# Patient Record
Sex: Female | Born: 1935 | Race: White | Hispanic: No | Marital: Married | Smoking: Former smoker
Health system: Southern US, Community
[De-identification: ages and names within clinical notes are randomized; demographics above are authoritative.]

## PROBLEM LIST (undated history)

## (undated) DIAGNOSIS — M1990 Unspecified osteoarthritis, unspecified site: Secondary | ICD-10-CM

## (undated) DIAGNOSIS — J302 Other seasonal allergic rhinitis: Secondary | ICD-10-CM

## (undated) DIAGNOSIS — C4491 Basal cell carcinoma of skin, unspecified: Secondary | ICD-10-CM

## (undated) DIAGNOSIS — J45909 Unspecified asthma, uncomplicated: Secondary | ICD-10-CM

## (undated) DIAGNOSIS — F0390 Unspecified dementia without behavioral disturbance: Secondary | ICD-10-CM

## (undated) DIAGNOSIS — F419 Anxiety disorder, unspecified: Secondary | ICD-10-CM

## (undated) HISTORY — DX: Anxiety disorder, unspecified: F41.9

## (undated) HISTORY — PX: BREAST LUMPECTOMY: SHX2

## (undated) HISTORY — DX: Unspecified osteoarthritis, unspecified site: M19.90

## (undated) HISTORY — PX: APPENDECTOMY: SHX54

## (undated) HISTORY — PX: HIP SURGERY: SHX245

## (undated) HISTORY — PX: JOINT REPLACEMENT: SHX530

## (undated) HISTORY — PX: LUMBAR DISC SURGERY: SHX700

## (undated) HISTORY — PX: TONSILLECTOMY: SUR1361

## (undated) HISTORY — DX: Unspecified asthma, uncomplicated: J45.909

## (undated) HISTORY — DX: Other seasonal allergic rhinitis: J30.2

## (undated) HISTORY — DX: Basal cell carcinoma of skin, unspecified: C44.91

---

## 1966-02-22 HISTORY — PX: OTHER SURGICAL HISTORY: SHX169

## 1998-10-24 ENCOUNTER — Other Ambulatory Visit: Admission: RE | Admit: 1998-10-24 | Discharge: 1998-10-24 | Payer: Self-pay | Admitting: Obstetrics and Gynecology

## 2000-02-11 ENCOUNTER — Encounter: Admission: RE | Admit: 2000-02-11 | Discharge: 2000-02-11 | Payer: Self-pay | Admitting: Internal Medicine

## 2000-02-11 ENCOUNTER — Encounter: Payer: Self-pay | Admitting: Internal Medicine

## 2000-08-26 ENCOUNTER — Encounter: Admission: RE | Admit: 2000-08-26 | Discharge: 2000-08-26 | Payer: Self-pay | Admitting: Internal Medicine

## 2000-08-26 ENCOUNTER — Encounter: Payer: Self-pay | Admitting: Internal Medicine

## 2001-05-23 ENCOUNTER — Ambulatory Visit (HOSPITAL_COMMUNITY): Admission: RE | Admit: 2001-05-23 | Discharge: 2001-05-23 | Payer: Self-pay | Admitting: Internal Medicine

## 2001-05-23 HISTORY — PX: COLONOSCOPY: SHX174

## 2001-06-19 ENCOUNTER — Encounter: Payer: Self-pay | Admitting: Otolaryngology

## 2001-06-19 ENCOUNTER — Ambulatory Visit (HOSPITAL_COMMUNITY): Admission: RE | Admit: 2001-06-19 | Discharge: 2001-06-19 | Payer: Self-pay | Admitting: Otolaryngology

## 2002-08-24 ENCOUNTER — Ambulatory Visit (HOSPITAL_COMMUNITY): Admission: RE | Admit: 2002-08-24 | Discharge: 2002-08-24 | Payer: Self-pay | Admitting: Internal Medicine

## 2002-08-24 ENCOUNTER — Encounter: Payer: Self-pay | Admitting: Internal Medicine

## 2003-03-12 ENCOUNTER — Ambulatory Visit (HOSPITAL_COMMUNITY): Admission: RE | Admit: 2003-03-12 | Discharge: 2003-03-12 | Payer: Self-pay | Admitting: *Deleted

## 2004-03-31 ENCOUNTER — Ambulatory Visit (HOSPITAL_COMMUNITY): Admission: RE | Admit: 2004-03-31 | Discharge: 2004-03-31 | Payer: Self-pay | Admitting: Internal Medicine

## 2004-08-11 ENCOUNTER — Emergency Department (HOSPITAL_COMMUNITY): Admission: EM | Admit: 2004-08-11 | Discharge: 2004-08-11 | Payer: Self-pay | Admitting: Emergency Medicine

## 2005-07-01 ENCOUNTER — Ambulatory Visit (HOSPITAL_COMMUNITY): Admission: RE | Admit: 2005-07-01 | Discharge: 2005-07-01 | Payer: Self-pay | Admitting: Internal Medicine

## 2005-09-06 ENCOUNTER — Ambulatory Visit: Payer: Self-pay | Admitting: Orthopedic Surgery

## 2006-07-04 ENCOUNTER — Ambulatory Visit: Payer: Self-pay | Admitting: Orthopedic Surgery

## 2006-08-08 ENCOUNTER — Ambulatory Visit: Payer: Self-pay | Admitting: Orthopedic Surgery

## 2006-09-06 ENCOUNTER — Encounter: Payer: Self-pay | Admitting: Orthopedic Surgery

## 2006-09-06 ENCOUNTER — Ambulatory Visit: Payer: Self-pay | Admitting: Orthopedic Surgery

## 2006-09-06 ENCOUNTER — Inpatient Hospital Stay (HOSPITAL_COMMUNITY): Admission: RE | Admit: 2006-09-06 | Discharge: 2006-09-10 | Payer: Self-pay | Admitting: Orthopedic Surgery

## 2006-09-21 ENCOUNTER — Ambulatory Visit: Payer: Self-pay | Admitting: Orthopedic Surgery

## 2006-10-19 ENCOUNTER — Ambulatory Visit: Payer: Self-pay | Admitting: Orthopedic Surgery

## 2006-12-01 ENCOUNTER — Ambulatory Visit: Payer: Self-pay | Admitting: Orthopedic Surgery

## 2006-12-01 DIAGNOSIS — M169 Osteoarthritis of hip, unspecified: Secondary | ICD-10-CM

## 2007-08-28 ENCOUNTER — Encounter: Payer: Self-pay | Admitting: Orthopedic Surgery

## 2007-09-18 ENCOUNTER — Ambulatory Visit: Payer: Self-pay | Admitting: Orthopedic Surgery

## 2007-09-18 DIAGNOSIS — Z96649 Presence of unspecified artificial hip joint: Secondary | ICD-10-CM

## 2008-09-18 ENCOUNTER — Ambulatory Visit: Payer: Self-pay | Admitting: Orthopedic Surgery

## 2008-09-18 DIAGNOSIS — M7580 Other shoulder lesions, unspecified shoulder: Secondary | ICD-10-CM

## 2008-09-23 ENCOUNTER — Emergency Department (HOSPITAL_COMMUNITY): Admission: EM | Admit: 2008-09-23 | Discharge: 2008-09-23 | Payer: Self-pay | Admitting: Emergency Medicine

## 2010-07-07 NOTE — Op Note (Signed)
[name], [name] NO.:  [id number]   MEDICAL RECORD NO.:  [id number]          PATIENT TYPE:  AMB   LOCATION:  DAY                           FACILITY:  [facility]   PHYSICIAN:  Vickki Hearing, M.D.DATE OF BIRTH:  24-Dec-1931   DATE OF PROCEDURE:  09/06/2006  DATE OF DISCHARGE:                               OPERATIVE REPORT   HISTORY:  A 77 year old female with disabling osteoarthritis of the  right hip.  She was treated with oral medications, decrease in activity  and persisted to have problems with activities of daily living; she  could shop, she could not walk very far without disabling pain.   INDICATION:  Disabling pain.   PREOPERATIVE DIAGNOSIS:  Osteoarthritis, right hip.   POSTOPERATIVE DIAGNOSIS:  Osteoarthritis, right hip.   PROCEDURE:  Right total hip arthroplasty.   IMPLANTS:  DePuy Corail high-offset 11 Press-Fit stem, Pinnacle 50 cup  with 20- and 25-mm 6.5 screws; hole eliminator was used as well.  The  liner was a metal 36 inner diameter, 50 outer diameter liner and a 36 +5  head.   FINDINGS:  There was severe osteoarthritis of the entire hip joint,  femoral and acetabular sides.   BLOOD LOSS:  250 mL, estimated.   ANESTHETIC:  Spinal.   ASSISTANTS:  1. Wayne McFadder.  2. Valetta Close.   SURGEON:  Vickki Hearing, M.D.   COUNTS OF NEEDLES AND SPONGES:  Correct at end of procedure.   RADIOGRAPH:  Taken intraoperatively showed that the components were in  excellent alignment.   SPECIMEN:  Femoral head, went to Pathology.   DETAILS OF PROCEDURE:  Rachel [name] was identified in the preop holding  area.  The right hip was marked as the surgical site; I countersigned  that.  The history and physical was also updated and her antibiotics  were started.  She was taken to the operating room for spinal  anesthetic.  She was then placed in lateral decubitus position, right  side up, axillary roll, padding of left and right arm,  padding of left  leg.  She then had a sterile prep and drape with DuraPrep.  A time-out  procedure was completed.  Procedure was confirmed as a right total hip  arthroplasty on Rachel [name].  Antibiotics were confirmed to be  within an hour of our skin incision.   Skin incision was made over the greater trochanter and extended  proximally and distally down to the fascial layer.  The fascial layer  was then split in line with the skin incision, exposing the greater  trochanteric bursa, which was resected.  We then identified the anterior  and posterior aspects of the gluteus medius and minimus tendons.   The anterior half of the abductor mass was resected in line with the  vastus lateralis and reflected anteriorly as 1 continuous flap.  The hip  capsule was exposed and excised.  Two pins were placed in the acetabulum  to retract the musculature and soft tissue.  The hip was dislocated and  the head was removed with an oscillating saw.   The leg  was placed in position and a femoral neck cutting guide was  placed.  The second neck cut was made and curette was used to enter the  canal.  Starter reamer was passed followed by serial broaching up to a  size 11.  We then planed the femoral neck and accepted an 11 as our stem  size.   We then placed the leg in extension, exposed the acetabulum by removing  osteophytes and soft tissue, identified the depths of it with a curette.  We started with a 44 reamer, reached the bottom of the acetabulum and  then reamed in sequential fashion up to a size 50.  We took a 50 trial  cup and it bottomed out well.  We then trialed of the cup in 45 degrees  of abduction and 15-20 degrees of anterior version.  We used a 36 +5  head and found that to be stable with good tension on the abductors,  excellent hip flexion, internal rotation, adduction and extension,  external rotation.   We then removed our trial components, passed sutures through the greater   trochanter, passed our 11 high-offset stem and got an excellent fit.  We  placed the acetabular cup, applied 2 screws, placed the hole eliminator,  the metal liner and then reduced the hip with a +5 thirty-six head.  Excellent range of motion was obtained.  This matched our trials.  We  then took an x-ray and was happy with the x-ray.  The wounds were then  irrigated and closed by closing the abductors with the leg internally  rotated using the sutures we had placed through the greater trochanter.  We then closed the fascial layer with interrupted suture of #1 Bralon.  We used further interrupted sutures to close the fascial layer and 0 and  2-0 Monocryl to close the subcu tissue.  Skin staples were used to  reapproximate the skin edges.  We did 2 additional things; at the end of  the fascial layer closure, we injected 30 mL of Marcaine deep to this  tissue; we passed a pain pump catheter above this layer and the subcu  tissue.  We applied sterile dressings, placed the patient supine on the  bed and checked her leg lengths; they were absolutely equal.  We applied  the abduction pillow and took the patient to recovery room in stable  condition.      Vickki Hearing, M.D.  Electronically Signed     SEH/MEDQ  D:  09/06/2006  T:  09/07/2006  Job:  [id number]

## 2010-07-07 NOTE — H&P (Signed)
Rachel [name], CLINE NO.:  [id number]   MEDICAL RECORD NO.:  [id number]           PATIENT TYPE:  AMB   LOCATION:                                FACILITY:  [facility]   PHYSICIAN:  Vickki Hearing, M.D.DATE OF BIRTH:  1932/02/19   DATE OF ADMISSION:  DATE OF DISCHARGE:  LH                              HISTORY & PHYSICAL   CHIEF COMPLAINT:  Right hip pain.   HISTORY:  This is a 77 year old female who presented to me July 2007  with right hip and groin pain which radiated to the right knee and  occasionally to the right buttock.  She is status post lumbar  laminectomy in 1985 and indicated that this pain does not feel like the  pain she had at that time. At that time her pain had been present for  over 3 months, and she was worked up, found to be taking Advil with good  relief, and we put her an exercise program after x-rays showed an  osteoarthritic hip and some degenerative disk disease in the lumbar  spine. I have seen her on two separate occasions since then, and we have  noted that she is having increased pain.  Her repeat x-rays showed  progression of her arthritis, so she has failed nonoperative care.  She  has groin pain with ambulation. She cannot do the activities that she  likes, and she has opted for total hip replacement.   REVIEW OF SYSTEMS:  Eight were normal.  Two were positive.  SKIN:  Itching.  MUSCULOSKELETAL:  Joint pain.   PAST HISTORY:  No known allergies.   MEDICAL PROBLEMS:  None.   SURGERY:  1. Tonsillectomy.  2. Appendectomy.  3. Lumpectomy.  4. Lumbar disk surgery.   PHARMACY:  [facility].   MEDICATIONS:  1. Paxil 10.  2. Fosamax 70.  3. Prempro 0.3/1.5/  4. Benadryl.  5. Calcium with vitamin D.  6. Ocuvite.  7. Allergy shots weekly.   FAMILY HISTORY:  Osteoarthritis and asthma.   SOCIAL HISTORY:  Family physician is Dr. Ouida Sills. She is married.  She is  a Futures trader.  She does not smoke. alcohol use:  Moderate.  Caffeine use:  3-4 cups a day. Education:  1 year of college. She has a supportive  husband, and her daughters is one of the CRNAs here at [facility]   PHYSICAL EXAMINATION:  VITAL SIGNS:  Weight 145, pulse 64, respiratory  rate 18.  GENERAL APPEARANCE:  Normal development, grooming, nutrition, hygiene,  body habitus, ectomorphic.  PERIPHERAL VASCULAR SYSTEM:  Pulses strong, temperature normal. No  edema, tenderness or swelling.  No major varicose veins.  LYMPHATIC/ENDOCRINE:  Normal.  MUSCULOSKELETAL:  Gait and station did not show appreciable limp.  RIGHT LOWER EXTREMITY:  Examination revealed hip flexion only to 90  degrees where there was pain.  She had painful internal rotation,  limited 15 degrees, external rotation approximately 45 degrees. Hip was  stable.  Muscle strength and tone normal.  LEFT HIP:  120 degrees flexion, internal rotation 30, external rotation  45, stable.  Strength and  muscle tone normal.  No pain.  UPPER EXTREMITY:  On the left, normal alignment.  No contracture,  subluxation, atrophy, tremor, or weakness.  RIGHT UPPER EXTREMITY:  Mild impingement, otherwise alignment normal.  Motor strength showed some weakness in the supraspinatus. There is no  atrophy, tremor or subluxation or instability.  SKIN:  All four extremities normal.  NEUROPSYCH:  Testing of the neuropsych system showed normal  coordination, reflexes, sensation, orientation, and mood.   Radiographs show end-stage arthritis of the right hip.   DIAGNOSIS:  Right hip osteoarthritis.   PLAN:  Right total hip arthroplasty.   Informed consent process has been completed with the patient after  previously giving her literature about total hips.  I answered all her  questions.  We reviewed several things which include but are not limited  to bleeding, infection and its treatment, DVT, pulmonary embolus,  dislocation and its treatment, loosening, and she, in the presence of  her daughter, agreed to  go ahead with the surgery with the known risks  as stated.   PLAN:  Right total hip with a DePuy implant.      Vickki Hearing, M.D.  Electronically Signed     SEH/MEDQ  D:  08/08/2006  T:  08/08/2006  Job:  [id number]

## 2010-07-10 NOTE — Discharge Summary (Signed)
Rachel [name], Rachel [name] NO.:  [id number]   MEDICAL RECORD NO.:  [id number]          PATIENT TYPE:  INP   LOCATION:  A338                          FACILITY:  [facility]   PHYSICIAN:  Vickki Hearing, M.D.DATE OF BIRTH:  02/25/1931   DATE OF ADMISSION:  09/06/2006  DATE OF DISCHARGE:  07/19/2008LH                               DISCHARGE SUMMARY   ADMITTING DIAGNOSIS:  Osteoarthritis right hip.   HISTORY:  This is a 77 year old female with end-stage osteoarthritis of  the right hip.  She presented with right hip and groin pain which  radiated to the right knee, and occasionally to the right buttock.  She  is status post lumbar laminectomy in 1985.  This patient was initially  treated with continuation of Advil, which gave her good relief  initially; an exercise program, and serial x-rays.  Her pain increased.  Repeat radiographs showed progression of her arthritis.  Her activities  of daily living became more difficult, and after reviewing the risks and  benefits of surgery versus continued non-operative treatment, she wished  to have a right total hip replacement.   The patient was admitted on September 06, 2006 she was admitted to Day  Surgery.  She underwent an uncomplicated right total hip replacement  with a metal-on-metal DePuy total hip system.  She lost 250 mL of blood  under a spinal anesthetic.  There are no complications.  The femoral  head was sent to pathology. Dr. Romeo Apple was the operating surgeon.   IMPLANTS:  Were the following:  1. Corail high offset #11 hip stem Press-Fit.  2. Pinnacle Sector II acetabular cup size 50 with a hole eliminator.  3. A 36 mm inner diameter x 50 mm outer diameter Pinnacle metal      insert.  4. Two cancellous bone screws.  5. A size 36 mm, +5 Articul/EZE metal-on-metal femoral head   HOSPITAL COURSE:  The patient's hospital course was marked by  postoperative anemia, with hemoglobin dropping to 8.1.  She was  transfused 1  auto unit and was discharged home with a hemoglobin of 10.  She also had increased right hip pain, which was clinically associated  with radicular symptoms from her lumbar spine.  This was treated with  Vicodin on a scheduled basis every 4 hours, Robaxin 500 mg every 6  hours; this seemed to give her relief and she was discharged home.  She  was able to ambulate 80 feet with a stable gait pattern, weightbearing  as tolerated on the right leg with a walker, and minimal assistance.   LABORATORY STUDIES:  Hemoglobin 10.6 (initial hemoglobin was 12.6).  Chem-7:  Potassium 4, glucose 119, BUN 6, creatinine 0.67.  GFR was  greater than 60 (which was appropriate).   DISCHARGE MEDICATIONS:  1. Feosol one p.o. b.i.d.  2. Lovenox 30 mg subcu 21 doses.  3. Robaxin 500 mg q. 6 p.r.n. spasms.  She was to resume:  1. Caltrate with vitamin D 600 mg daily.  2. Benadryl 25 mg daily.  3. Ocuvite one daily.  4. Albuterol inhaler as needed.  5. Pulmicort  2 puffs daily as needed.   FOLLOW-UP:  Within 2 weeks.   DISCHARGE INSTRUCTIONS:  She was to have physical therapy with anterior  hip precautions.      Vickki Hearing, M.D.  Electronically Signed     SEH/MEDQ  D:  09/24/2006  T:  09/25/2006  Job:  [id number]

## 2010-07-10 NOTE — Op Note (Signed)
[facility]  Patient:    Rachel [name], Rachel [name]. Visit Number: [id number] MRN: [id number]          Service Type: Attending:  Roetta Sessions, M.D. Dictated by:   Roetta Sessions, M.D. Proc. Date: 05/23/01   CC:         Carylon Perches, M.D.   Operative Report  PROCEDURE:  Screening colonoscopy.  ENDOSCOPIST:  Roetta Sessions, M.D.  INDICATIONS FOR PROCEDURE:  This patient is a 77 year old lady referred for colorectal cancer screening.  She is devoid of any lower GI tract symptoms. She has a family history of a second degree relative with colorectal cancer. A sigmoidoscopy 5 years ago was reportedly negative.  Colonoscopy is now being done as advance screening.  This approach has been discussed with the patient at the bedside.  The potential risks, benefits, and alternatives have been reviewed.  I feel that she is low-risk for conscious sedation in the way of Versed and Demerol.  Please see my handwritten H&P for more information.  PROCEDURE NOTE:  O2 saturation, blood pressure, pulse, and respirations were monitored throughout the entirety procedure.  CONSCIOUS SEDATION:  Versed 3 mg IV, Demerol 75 mg IV in divided doses.  INSTRUMENT:  Olympus videochip colonoscope.  FINDINGS:  The digital rectal exam revealed no abnormalities.  VIDEOSCOPIC FINDINGS:  The prep was good.  RECTUM:  Examination of the rectal mucosa including a retroflex view of the anal verge revealed only anal papilla, and internal hemorrhoid.  The rectum otherwise appeared normal.  COLON:  The colonic mucosa was surveyed from the rectosigmoid junction through the left transverse right colon to the area of the appendiceal orifice, ileocecal valve, and cecum.  These structures were well seen and photographed for the record.  The patient was noted to have scattered left-sided diverticula.  The remainder of the colonic mucosa appeared normal.  From the level of the cecum, ileocecal valve the  scope was slowly withdrawn.  All previously mentioned mucosal surfaces were again seen; and again, no other abnormalities were observed.  The patient tolerated the procedure well and was reacted in endoscopy.  IMPRESSION: 1. Anal papilla and internal hemorrhoids, otherwise normal rectum. 2. Left-sided diverticula.  The remainder of the colonic mucosa appeared    normal.  RECOMMENDATIONS: 1. Diverticulosis literature provided to the patient. 2. Repeat colonoscopy in 10 years. Dictated by:   Roetta Sessions, M.D. Attending:  Roetta Sessions, M.D. DD:  05/23/01 TD:  05/25/01 Job: [id number]

## 2010-12-07 LAB — BASIC METABOLIC PANEL
BUN: 6
BUN: 6
CO2: 29
CO2: 30
Calcium: 8.2 — ABNORMAL LOW
Chloride: 104
Chloride: 104
Creatinine, Ser: 0.67
GFR calc Af Amer: >60
GFR calc non Af Amer: >60
Glucose, Bld: 119 — ABNORMAL HIGH
Glucose, Bld: 120 — ABNORMAL HIGH
Glucose, Bld: 145 — ABNORMAL HIGH
Potassium: 4
Potassium: 4
Potassium: 4.5
Sodium: 137
Sodium: 141

## 2010-12-07 LAB — CBC
HCT: 26.7 — ABNORMAL LOW
Hemoglobin: 10 — ABNORMAL LOW
Hemoglobin: 9.1 — ABNORMAL LOW
MCHC: 34.3
MCV: 89.7
MCV: 90.6
Platelets: 200
Platelets: 203
RBC: 2.98 — ABNORMAL LOW
RBC: 3.24 — ABNORMAL LOW
RDW: 13.7
RDW: 14.1 — ABNORMAL HIGH
WBC: 10.7 — ABNORMAL HIGH
WBC: 11.4 — ABNORMAL HIGH

## 2010-12-07 LAB — DIFFERENTIAL
Basophils Absolute: 0
Basophils Absolute: 0
Basophils Relative: 0
Basophils Relative: 0
Eosinophils Absolute: 0
Eosinophils Absolute: 0.1
Eosinophils Absolute: 0.2
Eosinophils Relative: 1
Eosinophils Relative: 1
Lymphocytes Relative: 14
Lymphocytes Relative: 15
Lymphs Abs: 1
Lymphs Abs: 1.5
Monocytes Absolute: 1.2 — ABNORMAL HIGH
Monocytes Relative: 11
Monocytes Relative: 12 — ABNORMAL HIGH
Neutro Abs: 6.3
Neutro Abs: 7.9 — ABNORMAL HIGH
Neutro Abs: 8.1 — ABNORMAL HIGH
Neutrophils Relative %: 74

## 2010-12-08 LAB — BASIC METABOLIC PANEL
Chloride: 105
GFR calc Af Amer: >60
GFR calc non Af Amer: >60
Glucose, Bld: 87
Potassium: 4.2

## 2010-12-08 LAB — CBC
Hemoglobin: 12.6
RBC: 4.21
WBC: 7.3

## 2010-12-08 LAB — CROSSMATCH: Antibody Screen: NEGATIVE

## 2011-12-28 ENCOUNTER — Telehealth: Payer: Self-pay

## 2011-12-28 NOTE — Telephone Encounter (Signed)
Pt had called to schedule next colonoscopy. Last one was 05/2001. Having a little constipation and little blood in stool/ and on paper. Ov with Lorenza Burton, NP on 01/10/2012 at 3:00 PM.

## 2012-01-06 ENCOUNTER — Other Ambulatory Visit (HOSPITAL_COMMUNITY): Payer: Self-pay | Admitting: Internal Medicine

## 2012-01-06 DIAGNOSIS — Z139 Encounter for screening, unspecified: Secondary | ICD-10-CM

## 2012-01-07 ENCOUNTER — Encounter: Payer: Self-pay | Admitting: Internal Medicine

## 2012-01-10 ENCOUNTER — Encounter: Payer: Self-pay | Admitting: Urgent Care

## 2012-01-10 ENCOUNTER — Ambulatory Visit (INDEPENDENT_AMBULATORY_CARE_PROVIDER_SITE_OTHER): Payer: Medicare Other | Admitting: Urgent Care

## 2012-01-10 VITALS — BP 122/67 | HR 87 | Temp 97.5°F | Ht 61.5 in | Wt 144.8 lb

## 2012-01-10 DIAGNOSIS — K921 Melena: Secondary | ICD-10-CM

## 2012-01-10 MED ORDER — PEG-KCL-NACL-NASULF-NA ASC-C 100 G PO SOLR: 1.0000 | ORAL | Status: DC

## 2012-01-10 NOTE — Patient Instructions (Addendum)
Colonoscopy with Dr Jena Gauss Bloody Stools Bloody stools often mean that there is a problem in the digestive tract. Your caregiver may use the term "melena" to describe black, tarry, and bad smelling stools or "hematochezia" to describe red or maroon-colored stools. Blood seen in the stool can be caused by bleeding anywhere along the intestinal tract.  A black stool usually means that blood is coming from the upper part of the gastrointestinal tract (esophagus, stomach, or small bowel). Passing maroon-colored stools or bright red blood usually means that blood is coming from lower down in the large bowel or the rectum. However, sometimes massive bleeding in the stomach or small intestine can cause bright red bloody stools.  Consuming black licorice, lead, iron pills, medicines containing bismuth subsalicylate, or blueberries can also cause black stools. Your caregiver can test black stools to see if blood is present. It is important that the cause of the bleeding be found. Treatment can then be started, and the problem can be corrected. Rectal bleeding may not be serious, but you should not assume everything is okay until you know the cause.It is very important to follow up with your caregiver or a specialist in gastrointestinal problems. CAUSES  Blood in the stools can come from various underlying causes.Often, the cause is not found during your first visit. Testing is often needed to discover the cause of bleeding in the gastrointestinal tract. Causes range from simple to serious or even life-threatening.Possible causes include:  Hemorrhoids.These are veins that are full of blood (engorged) in the rectum. They cause pain, inflammation, and may bleed.  Anal fissures.These are areas of painful tearing which may bleed. They are often caused by passing hard stool.  Diverticulosis.These are pouches that form on the colon over time, with age, and may bleed significantly.  Diverticulitis.This is  inflammation in areas with diverticulosis. It can cause pain, fever, and bloody stools, although bleeding is rare.  Proctitis and colitis. These are inflamed areas of the rectum or colon. They may cause pain, fever, and bloody stools.  Polyps and cancer. Colon cancer is a leading cause of preventable cancer death.It often starts out as precancerous polyps that can be removed during a colonoscopy, preventing progression into cancer. Sometimes, polyps and cancer may cause rectal bleeding.  Gastritis and ulcers.Bleeding from the upper gastrointestinal tract (near the stomach) may travel through the intestines and produce black, sometimes tarry, often bad smelling stools. In certain cases, if the bleeding is fast enough, the stools may not be black, but red and the condition may be life-threatening. SYMPTOMS  You may have stools that are bright red and bloody, that are normal color with blood on them, or that are dark black and tarry. In some cases, you may only have blood in the toilet bowl. Any of these cases need medical care. You may also have:  Pain at the anus or anywhere in the rectum.  Lightheadedness or feeling faint.  Extreme weakness.  Nausea or vomiting.  Fever. DIAGNOSIS Your caregiver may use the following methods to find the cause of your bleeding:  Taking a medical history. Age is important. Older people tend to develop polyps and cancer more often. If there is anal pain and a hard, large stool associated with bleeding, a tear of the anus may be the cause. If blood drips into the toilet after a bowel movement, bleeding hemorrhoids may be the problem. The color and frequency of the bleeding are additional considerations. In most cases, the medical history provides clues,  but seldom the final answer.  A visual and finger (digital) exam. Your caregiver will inspect the anal area, looking for tears and hemorrhoids. A finger exam can provide information when there is tenderness or a  growth inside. In men, the prostate is also examined.  Endoscopy. Several types of small, long scopes (endoscopes) are used to view the colon.  In the office, your caregiver may use a rigid, or more commonly, a flexible viewing sigmoidoscope. This exam is called flexible sigmoidoscopy. It is performed in 5 to 10 minutes.  A more thorough exam is accomplished with a colonoscope. It allows your caregiver to view the entire 5 to 6 foot long colon. Medicine to help you relax (sedative) is usually given for this exam. Frequently, a bleeding lesion may be present beyond the reach of the sigmoidoscope. So, a colonoscopy may be the best exam to start with. Both exams are usually done on an outpatient basis. This means the patient does not stay overnight in the hospital or surgery center.  An upper endoscopy may be needed to examine your stomach. Sedation is used and a flexible endoscope is put in your mouth, down to your stomach.  A barium enema X-ray. This is an X-ray exam. It uses liquid barium inserted by enema into the rectum. This test alone may not identify an actual bleeding point. X-rays highlight abnormal shadows, such as those made by lumps (tumors), diverticuli, or colitis. TREATMENT  Treatment depends on the cause of your bleeding.   For bleeding from the stomach or colon, the caregiver doing your endoscopy or colonoscopy may be able to stop the bleeding as part of the procedure.  Inflammation or infection of the colon can be treated with medicines.  Many rectal problems can be treated with creams, suppositories, or warm baths.  Surgery is sometimes needed.  Blood transfusions are sometimes needed if you have lost a lot of blood.  For any bleeding problem, let your caregiver know if you take aspirin or other blood thinners regularly. HOME CARE INSTRUCTIONS   Take any medicines exactly as prescribed.  Keep your stools soft by eating a diet high in fiber. Prunes (1 to 3 a day) work  well for many people.  Drink enough water and fluids to keep your urine clear or pale yellow.  Take sitz baths if advised. A sitz bath is when you sit in a bathtub with warm water for 10 to 15 minutes to soak, soothe, and cleanse the rectal area.  If enemas or suppositories are advised, be sure you know how to use them. Tell your caregiver if you have problems with this.  Monitor your bowel movements to look for signs of improvement or worsening. SEEK MEDICAL CARE IF:   You do not improve in the time expected.  Your condition worsens after initial improvement.  You develop any new symptoms. SEEK IMMEDIATE MEDICAL CARE IF:   You develop severe or prolonged rectal bleeding.  You vomit blood.  You feel weak or faint.  You have a fever. MAKE SURE YOU:  Understand these instructions.  Will watch your condition.  Will get help right away if you are not doing well or get worse. Document Released: 01/29/2002 Document Revised: 05/03/2011 Document Reviewed: 06/26/2010 [facility] Patient Information 2013 [place], [place].

## 2012-01-10 NOTE — Assessment & Plan Note (Signed)
Rachel [name] is a pleasant 78 y.o. female due for screening colonoscopy. Upon further triage, she noted one episode of hematochezia after a hard stool. I suspect this may have been from her hemorrhoids. We'll proceed with colonoscopy with Dr. Jena Gauss for further evaluation.  I have discussed risks & benefits which include, but are not limited to, bleeding, infection, perforation & drug reaction.  The patient agrees with this plan & written consent will be obtained.

## 2012-01-10 NOTE — Progress Notes (Signed)
Primary Care Physician:  FAGAN,ROY, MD Primary Gastroenterologist:  Dr. Rourk  Chief Complaint  Patient presents with  . Colonoscopy  . Constipation   HPI:  Rachel [name] is a 78 y.o. female here to set up colonoscopy. Last colonoscopy was in April 2003 by Dr. Rourk and she was found to have anal papilla, internal hemorrhoids, and left-sided diverticula. She has had one recent episode of hematochezia in the way of a small amount of bright red blood on the toilet tissue with wiping after a hard bowel movement.  She is seeing no further rectal bleeding. She denies abdominal pain or diarrhea. Denies heartburn, indigestion, nausea, vomiting, dysphagia, odynophagia or anorexia.  She takes IBU 400mg AM & 400mg in PM for arthritis & she feels that she cannot function without it.    Past Medical History  Diagnosis Date  . Anxiety   . Basal cell cancer   . Arthritis     Past Surgical History  Procedure Date  . Colonoscopy 05/23/01     RMR:Anal papilla and internal hemorrhoids, otherwise normal rectum/  Left-sided diverticula.  The remainder of the colonic mucosa appeared normal  . Tonsillectomy   . Appendectomy   . Breast lumpectomy   . Lumbar disc surgery   . Hip surgery     right  . Bladder tack 1968    Current Outpatient Prescriptions  Medication Sig Dispense Refill  . beta carotene w/minerals (OCUVITE) tablet Take 1 tablet by mouth daily.      . citalopram (CELEXA) 20 MG tablet Take 20 mg by mouth daily.      . ibuprofen (ADVIL,MOTRIN) 200 MG tablet Take 400 mg by mouth 2 (two) times daily as needed.       . PROAIR HFA 108 (90 BASE) MCG/ACT inhaler       . peg 3350 powder (MOVIPREP) 100 G SOLR Take 1 kit (100 g total) by mouth as directed.  1 kit  0    Allergies as of 01/10/2012  . (No Known Allergies)    Family History:There is no known family history of colorectal carcinoma , liver disease, or inflammatory bowel disease in first-degree relatives.   Problem Relation Age  of Onset  . Colon cancer Paternal Uncle 45    History   Social History  . Marital Status: Married    Spouse Name: N/A    Number of Children: 4  . Years of Education: N/A   Occupational History  . retired, homemaker    Social History Main Topics  . Smoking status: Former Smoker -- 0.5 packs/day    Types: Cigarettes    Quit date: 02/22/1962  . Smokeless tobacco: Not on file     Comment: about 50 years ago  . Alcohol Use: Yes     Comment: wine 3-4 nights/wk  . Drug Use: No  . Sexually Active: Not on file   Other Topics Concern  . Not on file   Social History Narrative   Lives w/ husband   Review of Systems: Gen: Denies any fever, chills, sweats, anorexia, fatigue, weakness, malaise, weight loss, and sleep disorder CV: Denies chest pain, angina, palpitations, syncope, orthopnea, PND, peripheral edema, and claudication. Resp: Denies dyspnea at rest, dyspnea with exercise, cough, sputum, wheezing, coughing up blood, and pleurisy. GI: Denies vomiting blood, jaundice, and fecal incontinence.   Denies dysphagia or odynophagia. GU : Denies urinary burning, blood in urine, urinary frequency, urinary hesitancy, nocturnal urination, and urinary incontinence. MS: Chronic joint pain limitation of   movement with history of arthritis.  Derm: Denies rash, itching, dry skin, hives, moles, warts, or unhealing ulcers.  Psych: Denies depression, anxiety, memory loss, suicidal ideation, hallucinations, paranoia, and confusion. Heme: Denies bruising, bleeding, and enlarged lymph nodes. Neuro:  Denies any headaches, dizziness, paresthesias. Endo:  Denies any problems with DM, thyroid, adrenal function.  Physical Exam: BP 122/67  Pulse 87  Temp 97.5 F (36.4 C) (Temporal)  Ht 5' 1.5" (1.562 m)  Wt 144 lb 12.8 oz (65.681 kg)  BMI 26.92 kg/m2 No LMP recorded. Patient is not currently having periods (Reason: Other). General:   Alert,  Well-developed, well-nourished, pleasant and cooperative  in NAD Head:  Normocephalic and atraumatic. Eyes:  Sclera clear, no icterus.   Conjunctiva pink. Ears:  Normal auditory acuity. Nose:  No deformity, discharge, or lesions. Mouth:  No deformity or lesions,oropharynx pink & moist. Neck:  Supple; no masses or thyromegaly. Lungs:  Clear throughout to auscultation.   No wheezes, crackles, or rhonchi. No acute distress. Heart:  Regular rate and rhythm; no murmurs, clicks, rubs,  or gallops. Abdomen:  Normal bowel sounds.  No bruits.  Soft, non-tender and non-distended without masses, hepatosplenomegaly or hernias noted.  No guarding or rebound tenderness.   Rectal:  Deferred.  Msk:  Symmetrical without gross deformities. Normal posture. Pulses:  Normal pulses noted. Extremities:  No edema. Chronic arthritic changes in both hands. Neurologic:  Alert and  oriented x4;  grossly normal neurologically. Skin:  Intact without significant lesions or rashes. Lymph Nodes:  No significant cervical adenopathy. Psych:  Alert and cooperative. Normal mood and affect.   

## 2012-01-10 NOTE — Progress Notes (Signed)
Faxed to PCP

## 2012-01-11 ENCOUNTER — Ambulatory Visit (HOSPITAL_COMMUNITY)
Admission: RE | Admit: 2012-01-11 | Discharge: 2012-01-11 | Disposition: A | Discharge status: Home / Self Care | Payer: Medicare Other | Source: Ambulatory Visit | Attending: Internal Medicine | Admitting: Internal Medicine

## 2012-01-11 DIAGNOSIS — Z1231 Encounter for screening mammogram for malignant neoplasm of breast: Secondary | ICD-10-CM | POA: Insufficient documentation

## 2012-01-11 DIAGNOSIS — Z139 Encounter for screening, unspecified: Secondary | ICD-10-CM

## 2012-01-11 NOTE — Progress Notes (Signed)
REVIEWED.  

## 2012-01-19 ENCOUNTER — Encounter (HOSPITAL_COMMUNITY): Payer: Self-pay | Admitting: Pharmacy Technician

## 2012-02-01 ENCOUNTER — Ambulatory Visit (INDEPENDENT_AMBULATORY_CARE_PROVIDER_SITE_OTHER): Payer: Medicare Other

## 2012-02-01 ENCOUNTER — Encounter: Payer: Self-pay | Admitting: Orthopedic Surgery

## 2012-02-01 ENCOUNTER — Ambulatory Visit (INDEPENDENT_AMBULATORY_CARE_PROVIDER_SITE_OTHER): Payer: Medicare Other | Admitting: Orthopedic Surgery

## 2012-02-01 ENCOUNTER — Ambulatory Visit: Payer: Medicare Other | Admitting: Orthopedic Surgery

## 2012-02-01 VITALS — BP 120/60 | Ht 61.0 in | Wt 141.0 lb

## 2012-02-01 DIAGNOSIS — M6580 Other synovitis and tenosynovitis, unspecified site: Secondary | ICD-10-CM

## 2012-02-01 DIAGNOSIS — M25559 Pain in unspecified hip: Secondary | ICD-10-CM

## 2012-02-01 DIAGNOSIS — M76899 Other specified enthesopathies of unspecified lower limb, excluding foot: Secondary | ICD-10-CM

## 2012-02-01 MED ORDER — PREDNISONE 5 MG PO KIT: 5.0000 mg | PACK | ORAL | Status: DC

## 2012-02-01 NOTE — Progress Notes (Signed)
Patient ID: Rachel [name], female   DOB: 1931/10/29, 78 y.o.   MRN: [id number] Chief Complaint  Patient presents with  . Hip Pain    Right hip pain no injury    78 year old female status post right total hip arthroplasty in 2008 with a Depew carina high offset 11 press-fit stem, Pinnacle 50 metal cup +2 screws a 36+5 metal head with a 50 x 36 metal-on-metal liner  This was not the hip that was recalled  She's done very well and has maintained her active lifestyle. Approximately 6 months ago she noticed she had pain and she was eating in and out of the bathtub or stepping over a high object. She has pain in the front of the hip over the hip flexor and pain with activity which is mild and doesn't really bother her but she mentioned it to her primary care physician and he suggested that she get it checked out.  She describes dull 5/10 intermittent pain which is worse with activity especially stepping up or stepping over a high object. She denies any numbness tingling locking catching or mass.  Past Medical History  Diagnosis Date  . Anxiety   . Basal cell cancer   . Arthritis   . Seasonal allergies   . Asthma     Physical Exam(12)  Vital signs:   GENERAL: normal development   CDV: pulses are normal   Skin: normal  Lymph: nodes were not palpable/normal  Psychiatric: awake, alert and oriented  Neuro: normal sensation  MSK  Gait: She ambulates normally 1 Inspection right hip there is no mass no lymphadenopathy 2 Range of Motion hip flexion is greater than 120 no pain normal internal/external rotation normal leg lengths normal abduction normal adduction 3 Motor normal she does have pain in the supine position when flexing the leg  4 Stability normal  Other side:  5 Full range of motion no pain 6 no tenderness or deformity  Imaging AP pelvis and 2 views of the hip show no evidence of loosening excellent osseous integration of the stem as well as the cuff  Assessment: Hip  flexor tendinitis 1. Hip flexor tendonitis  PredniSONE 5 MG KIT  2. Hip pain  DG Hip Complete Right, PredniSONE 5 MG KIT       Plan: Recommend a steroid Dosepak and followup with me in one month. At this point with a normal x-ray no mass no loosening I do not think that this is an implant issue other than synovitis and tendinitis around the hip flexor. If this proves to be bothersome then we will initiate further diagnostic workup

## 2012-02-03 ENCOUNTER — Encounter (HOSPITAL_COMMUNITY)
Admission: RE | Disposition: A | Discharge status: Home / Self Care | Payer: Self-pay | Source: Ambulatory Visit | Attending: Internal Medicine

## 2012-02-03 ENCOUNTER — Encounter (HOSPITAL_COMMUNITY): Payer: Self-pay | Admitting: *Deleted

## 2012-02-03 ENCOUNTER — Ambulatory Visit (HOSPITAL_COMMUNITY)
Admission: RE | Admit: 2012-02-03 | Discharge: 2012-02-03 | Disposition: A | Discharge status: Home / Self Care | Payer: Medicare Other | Source: Ambulatory Visit | Attending: Internal Medicine | Admitting: Internal Medicine

## 2012-02-03 DIAGNOSIS — K648 Other hemorrhoids: Secondary | ICD-10-CM

## 2012-02-03 DIAGNOSIS — K5730 Diverticulosis of large intestine without perforation or abscess without bleeding: Secondary | ICD-10-CM

## 2012-02-03 DIAGNOSIS — K921 Melena: Secondary | ICD-10-CM

## 2012-02-03 HISTORY — PX: COLONOSCOPY: SHX5424

## 2012-02-03 SURGERY — COLONOSCOPY
Anesthesia: Moderate Sedation

## 2012-02-03 MED ORDER — STERILE WATER FOR IRRIGATION IR SOLN
Status: DC | PRN
  Administered 2012-02-03: 09:00:00

## 2012-02-03 MED ORDER — MIDAZOLAM HCL 5 MG/5ML IJ SOLN
INTRAMUSCULAR | Status: AC
  Filled 2012-02-03: qty 10

## 2012-02-03 MED ORDER — SODIUM CHLORIDE 0.45 % IV SOLN
INTRAVENOUS | Status: DC
  Administered 2012-02-03: 08:00:00 via INTRAVENOUS

## 2012-02-03 MED ORDER — MEPERIDINE HCL 100 MG/ML IJ SOLN
INTRAMUSCULAR | Status: AC
  Filled 2012-02-03: qty 1

## 2012-02-03 MED ORDER — MIDAZOLAM HCL 5 MG/5ML IJ SOLN
INTRAMUSCULAR | Status: DC | PRN
  Administered 2012-02-03 (×3): 1 mg via INTRAVENOUS
  Administered 2012-02-03: 2 mg via INTRAVENOUS

## 2012-02-03 MED ORDER — MEPERIDINE HCL 100 MG/ML IJ SOLN
INTRAMUSCULAR | Status: DC | PRN
  Administered 2012-02-03: 50 mg via INTRAVENOUS
  Administered 2012-02-03: 25 mg via INTRAVENOUS

## 2012-02-03 NOTE — H&P (View-Only) (Signed)
Primary Care Physician:  Carylon Perches, MD Primary Gastroenterologist:  Dr. Jena Gauss  Chief Complaint  Patient presents with  . Colonoscopy  . Constipation   HPI:  Rachel [name] is a 78 y.o. female here to set up colonoscopy. Last colonoscopy was in April 2003 by Dr. Jena Gauss and she was found to have anal papilla, internal hemorrhoids, and left-sided diverticula. She has had one recent episode of hematochezia in the way of a small amount of bright red blood on the toilet tissue with wiping after a hard bowel movement.  She is seeing no further rectal bleeding. She denies abdominal pain or diarrhea. Denies heartburn, indigestion, nausea, vomiting, dysphagia, odynophagia or anorexia.  She takes IBU 400mg  AM & 400mg  in PM for arthritis & she feels that she cannot function without it.    Past Medical History  Diagnosis Date  . Anxiety   . Basal cell cancer   . Arthritis     Past Surgical History  Procedure Date  . Colonoscopy 05/23/01     RUE:AVWU papilla and internal hemorrhoids, otherwise normal rectum/  Left-sided diverticula.  The remainder of the colonic mucosa appeared normal  . Tonsillectomy   . Appendectomy   . Breast lumpectomy   . Lumbar disc surgery   . Hip surgery     right  . Bladder tack 1968    Current Outpatient Prescriptions  Medication Sig Dispense Refill  . beta carotene w/minerals (OCUVITE) tablet Take 1 tablet by mouth daily.      . citalopram (CELEXA) 20 MG tablet Take 20 mg by mouth daily.      Marland Kitchen ibuprofen (ADVIL,MOTRIN) 200 MG tablet Take 400 mg by mouth 2 (two) times daily as needed.       Marland Kitchen PROAIR HFA 108 (90 BASE) MCG/ACT inhaler       . peg 3350 powder (MOVIPREP) 100 G SOLR Take 1 kit (100 g total) by mouth as directed.  1 kit  0    Allergies as of 01/10/2012  . (No Known Allergies)    Family History:There is no known family history of colorectal carcinoma , liver disease, or inflammatory bowel disease in first-degree relatives.   Problem Relation Age  of Onset  . Colon cancer Paternal Uncle 46    History   Social History  . Marital Status: Married    Spouse Name: N/A    Number of Children: 4  . Years of Education: N/A   Occupational History  . retired, homemaker    Social History Main Topics  . Smoking status: Former Smoker -- 0.5 packs/day    Types: Cigarettes    Quit date: 02/22/1962  . Smokeless tobacco: Not on file     Comment: about 50 years ago  . Alcohol Use: Yes     Comment: wine 3-4 nights/wk  . Drug Use: No  . Sexually Active: Not on file   Other Topics Concern  . Not on file   Social History Narrative   Lives w/ husband   Review of Systems: Gen: Denies any fever, chills, sweats, anorexia, fatigue, weakness, malaise, weight loss, and sleep disorder CV: Denies chest pain, angina, palpitations, syncope, orthopnea, PND, peripheral edema, and claudication. Resp: Denies dyspnea at rest, dyspnea with exercise, cough, sputum, wheezing, coughing up blood, and pleurisy. GI: Denies vomiting blood, jaundice, and fecal incontinence.   Denies dysphagia or odynophagia. GU : Denies urinary burning, blood in urine, urinary frequency, urinary hesitancy, nocturnal urination, and urinary incontinence. MS: Chronic joint pain limitation of  movement with history of arthritis.  Derm: Denies rash, itching, dry skin, hives, moles, warts, or unhealing ulcers.  Psych: Denies depression, anxiety, memory loss, suicidal ideation, hallucinations, paranoia, and confusion. Heme: Denies bruising, bleeding, and enlarged lymph nodes. Neuro:  Denies any headaches, dizziness, paresthesias. Endo:  Denies any problems with DM, thyroid, adrenal function.  Physical Exam: BP 122/67  Pulse 87  Temp 97.5 F (36.4 C) (Temporal)  Ht 5' 1.5" (1.562 m)  Wt 144 lb 12.8 oz (65.681 kg)  BMI 26.92 kg/m2 No LMP recorded. Patient is not currently having periods (Reason: Other). General:   Alert,  Well-developed, well-nourished, pleasant and cooperative  in NAD Head:  Normocephalic and atraumatic. Eyes:  Sclera clear, no icterus.   Conjunctiva pink. Ears:  Normal auditory acuity. Nose:  No deformity, discharge, or lesions. Mouth:  No deformity or lesions,oropharynx pink & moist. Neck:  Supple; no masses or thyromegaly. Lungs:  Clear throughout to auscultation.   No wheezes, crackles, or rhonchi. No acute distress. Heart:  Regular rate and rhythm; no murmurs, clicks, rubs,  or gallops. Abdomen:  Normal bowel sounds.  No bruits.  Soft, non-tender and non-distended without masses, hepatosplenomegaly or hernias noted.  No guarding or rebound tenderness.   Rectal:  Deferred.  Msk:  Symmetrical without gross deformities. Normal posture. Pulses:  Normal pulses noted. Extremities:  No edema. Chronic arthritic changes in both hands. Neurologic:  Alert and  oriented x4;  grossly normal neurologically. Skin:  Intact without significant lesions or rashes. Lymph Nodes:  No significant cervical adenopathy. Psych:  Alert and cooperative. Normal mood and affect.

## 2012-02-03 NOTE — Interval H&P Note (Signed)
History and Physical Interval Note:  02/03/2012 8:29 AM  Rachel [name]  has presented today for surgery, with the diagnosis of hematochezia, Screening TCS  The various methods of treatment have been discussed with the patient and family. After consideration of risks, benefits and other options for treatment, the patient has consented to  Procedure(s) (LRB) with comments: COLONOSCOPY (N/A) - 8:45 as a surgical intervention .  The patient's history has been reviewed, patient examined, no change in status, stable for surgery.  I have reviewed the patient's chart and labs.  Questions were answered to the patient's satisfaction.     Eula Listen  As above. Colonoscopy per plan.The risks, benefits, limitations, alternatives and imponderables have been reviewed with the patient. Questions have been answered. All parties are agreeable.

## 2012-02-03 NOTE — Op Note (Signed)
[facility] [address] [place], [address]   COLONOSCOPY PROCEDURE REPORT  PATIENT: Rachel [name], Rachel [name]  MR#:         [id number] BIRTHDATE: Jan 12, 1932 , 80  yrs. old GENDER: Female ENDOSCOPIST: R.  Roetta Sessions, MD FACP FACG REFERRED BY:  Carylon Perches, M.D. PROCEDURE DATE:  02/03/2012 PROCEDURE:     Diagnostic colonoscopy  INDICATIONS: Hematochezia;  Last colonoscopy 10 years ago-screening  INFORMED CONSENT:  The risks, benefits, alternatives and imponderables including but not limited to bleeding, perforation as well as the possibility of a missed lesion have been reviewed.  The potential for biopsy, lesion removal, etc. have also been discussed.  Questions have been answered.  All parties agreeable. Please see the history and physical in the medical record for more information.  MEDICATIONS: Versed 5 mg IV and Demerol 75 mg IV in divided doses.  DESCRIPTION OF PROCEDURE:  After a digital rectal exam was performed, the Pentax Colonoscope [id number]  colonoscope was advanced from the anus through the rectum and colon to the area of the cecum, ileocecal valve and appendiceal orifice.  The cecum was deeply intubated.  These structures were well-seen and photographed for the record.  From the level of the cecum and ileocecal valve, the scope was slowly and cautiously withdrawn.  The mucosal surfaces were carefully surveyed utilizing scope tip deflection to facilitate fold flattening as needed.  The scope was pulled down into the rectum where a thorough examination including retroflexion was performed.    FINDINGS:  Adequate preparation.  Internal hemorrhoids and anal papilla; otherwise, normal rectum.  Left-sided diverticula; the remainder of the colonic mucosa appeared normal.  THERAPEUTIC / DIAGNOSTIC MANEUVERS PERFORMED:  None  COMPLICATIONS: None  CECAL WITHDRAWAL TIME:  8 minutes  IMPRESSION:  Internal hemorrhoids   -  likely source of hematochezia.  Colonic diverticulosis.  RECOMMENDATIONS: Course of Anusol suppositories. Daily Benefiber. MiraLax when necessary. Followup Dr. Ouida Sills as scheduled..   _______________________________ eSigned:  R. Roetta Sessions, MD FACP [facility] 02/03/2012 9:09 AM   CC:

## 2012-02-07 ENCOUNTER — Encounter (HOSPITAL_COMMUNITY): Payer: Self-pay | Admitting: Internal Medicine

## 2012-02-29 ENCOUNTER — Ambulatory Visit (INDEPENDENT_AMBULATORY_CARE_PROVIDER_SITE_OTHER): Payer: Medicare Other | Admitting: Orthopedic Surgery

## 2012-02-29 ENCOUNTER — Encounter: Payer: Self-pay | Admitting: Orthopedic Surgery

## 2012-02-29 VITALS — BP 110/78 | Ht 61.0 in | Wt 141.0 lb

## 2012-02-29 DIAGNOSIS — M76899 Other specified enthesopathies of unspecified lower limb, excluding foot: Secondary | ICD-10-CM

## 2012-02-29 DIAGNOSIS — Z96649 Presence of unspecified artificial hip joint: Secondary | ICD-10-CM

## 2012-02-29 DIAGNOSIS — M6580 Other synovitis and tenosynovitis, unspecified site: Secondary | ICD-10-CM

## 2012-02-29 NOTE — Patient Instructions (Addendum)
activities as tolerated 

## 2012-02-29 NOTE — Progress Notes (Signed)
Patient ID: Rachel [name], female   DOB: [birth year], 79 y.o.   MRN: [id number] Chief Complaint  Patient presents with  . Follow-up    recheck right hip    1. TOTAL HIP FOLLOW-UP   2. Hip flexor tendonitis     BP 110/78  Ht 5\' 1"  (1.549 m)  Wt 141 lb (63.957 kg)  BMI 26.64 kg/m2  The patient is improved with oral medication. She has no difficulty now lifting her RIGHT hip.  She has normal range of motion without pain or tenderness.  Hip flexor tendinitis, resolved.  Follow up in 2 years for x-ray, RIGHT hip

## 2012-11-20 ENCOUNTER — Ambulatory Visit (HOSPITAL_COMMUNITY)
Admission: RE | Admit: 2012-11-20 | Discharge: 2012-11-20 | Disposition: A | Discharge status: Home / Self Care | Payer: Medicare Other | Source: Ambulatory Visit | Attending: Internal Medicine | Admitting: Internal Medicine

## 2012-11-20 ENCOUNTER — Other Ambulatory Visit (HOSPITAL_COMMUNITY): Payer: Self-pay | Admitting: Internal Medicine

## 2012-11-20 DIAGNOSIS — R05 Cough: Secondary | ICD-10-CM

## 2012-11-20 DIAGNOSIS — R072 Precordial pain: Secondary | ICD-10-CM | POA: Insufficient documentation

## 2012-11-20 DIAGNOSIS — R509 Fever, unspecified: Secondary | ICD-10-CM | POA: Insufficient documentation

## 2012-11-20 DIAGNOSIS — R059 Cough, unspecified: Secondary | ICD-10-CM | POA: Insufficient documentation

## 2012-11-28 ENCOUNTER — Emergency Department (HOSPITAL_COMMUNITY): Payer: Medicare Other

## 2012-11-28 ENCOUNTER — Emergency Department (HOSPITAL_COMMUNITY)
Admission: EM | Admit: 2012-11-28 | Discharge: 2012-11-28 | Disposition: A | Discharge status: Home / Self Care | Payer: Medicare Other | Attending: Emergency Medicine | Admitting: Emergency Medicine

## 2012-11-28 ENCOUNTER — Encounter (HOSPITAL_COMMUNITY): Payer: Self-pay | Admitting: *Deleted

## 2012-11-28 DIAGNOSIS — IMO0002 Reserved for concepts with insufficient information to code with codable children: Secondary | ICD-10-CM | POA: Insufficient documentation

## 2012-11-28 DIAGNOSIS — Z85828 Personal history of other malignant neoplasm of skin: Secondary | ICD-10-CM | POA: Insufficient documentation

## 2012-11-28 DIAGNOSIS — F411 Generalized anxiety disorder: Secondary | ICD-10-CM | POA: Insufficient documentation

## 2012-11-28 DIAGNOSIS — Z79899 Other long term (current) drug therapy: Secondary | ICD-10-CM | POA: Insufficient documentation

## 2012-11-28 DIAGNOSIS — J45909 Unspecified asthma, uncomplicated: Secondary | ICD-10-CM | POA: Insufficient documentation

## 2012-11-28 DIAGNOSIS — Z87891 Personal history of nicotine dependence: Secondary | ICD-10-CM | POA: Insufficient documentation

## 2012-11-28 DIAGNOSIS — R002 Palpitations: Secondary | ICD-10-CM | POA: Insufficient documentation

## 2012-11-28 DIAGNOSIS — M129 Arthropathy, unspecified: Secondary | ICD-10-CM | POA: Insufficient documentation

## 2012-11-28 LAB — BASIC METABOLIC PANEL
GFR calc Af Amer: 90 mL/min — ABNORMAL LOW (ref >90)
GFR calc non Af Amer: 77 mL/min — ABNORMAL LOW (ref >90)
Potassium: 4 mEq/L (ref 3.5–5.1)
Sodium: 134 mEq/L — ABNORMAL LOW (ref 135–145)

## 2012-11-28 LAB — CBC WITH DIFFERENTIAL/PLATELET
Basophils Relative: 0 % (ref 0–1)
Eosinophils Absolute: 0.2 10*3/uL (ref 0.0–0.7)
MCH: 30.1 pg (ref 26.0–34.0)
MCHC: 33.6 g/dL (ref 30.0–36.0)
Neutrophils Relative %: 71 % (ref 43–77)
Platelets: 411 10*3/uL — ABNORMAL HIGH (ref 150–400)

## 2012-11-28 MED ORDER — ALBUTEROL SULFATE HFA 108 (90 BASE) MCG/ACT IN AERS: 2.0000 | INHALATION_SPRAY | Freq: Four times a day (QID) | RESPIRATORY_TRACT | Status: AC | PRN

## 2012-11-28 MED ORDER — BUDESONIDE 180 MCG/ACT IN AEPB: 2.0000 | INHALATION_SPRAY | Freq: Two times a day (BID) | RESPIRATORY_TRACT | Status: DC

## 2012-11-28 NOTE — ED Provider Notes (Signed)
CSN: [id number]     Arrival date & time 11/28/12  2130 History   First MD Initiated Contact with Patient 11/28/12 2301     Chief Complaint  Patient presents with  . Irregular Heart Beat   (Consider location/radiation/quality/duration/timing/severity/associated sxs/prior Treatment) HPI Patient presents after an episode of palpitations.  The sensation was painless, left proximally 40 minutes.  There was no clear precipitant, no clear exacerbating or alleviating factors.  There is no concurrent lightheadedness, syncope, nausea, vomiting, incontinence. Patient has a week long history of URI like illness, but no recent vomiting or diarrhea. She notes that during this illness she has been anorexic.  Past Medical History  Diagnosis Date  . Anxiety   . Basal cell cancer   . Arthritis   . Seasonal allergies   . Asthma    Past Surgical History  Procedure Laterality Date  . Colonoscopy  05/23/01     WNU:UVOZ papilla and internal hemorrhoids, otherwise normal rectum/  Left-sided diverticula.  The remainder of the colonic mucosa appeared normal  . Tonsillectomy    . Appendectomy    . Breast lumpectomy    . Lumbar disc surgery    . Hip surgery      right  . Bladder tack  1968  . Colonoscopy  02/03/2012    Procedure: COLONOSCOPY;  Surgeon: Corbin Ade, MD;  Location: [facility];  Service: Endoscopy;  Laterality: N/A;  8:45   Family History  Problem Relation Age of Onset  . Colon cancer Paternal Uncle 45   History  Substance Use Topics  . Smoking status: Former Smoker -- 0.50 packs/day    Types: Cigarettes    Quit date: 02/22/1962  . Smokeless tobacco: Not on file     Comment: about 50 years ago  . Alcohol Use: Yes     Comment: wine 3-4 nights/wk   OB History   Grav Para Term Preterm Abortions TAB SAB Ect Mult Living                 Review of Systems  Constitutional:       Per HPI, otherwise negative  HENT:       Per HPI, otherwise negative  Respiratory:       Per  HPI, otherwise negative  Cardiovascular:       Per HPI, otherwise negative  Gastrointestinal: Negative for vomiting.  Endocrine:       Negative aside from HPI  Genitourinary:       Neg aside from HPI   Musculoskeletal:       Per HPI, otherwise negative  Skin: Negative.   Neurological: Negative for syncope.    Allergies  Review of patient's allergies indicates no known allergies.  Home Medications   Current Outpatient Rx  Name  Route  Sig  Dispense  Refill  . albuterol (PROAIR HFA) 108 (90 BASE) MCG/ACT inhaler   Inhalation   Inhale 2 puffs into the lungs every 6 (six) hours as needed for shortness of breath. Pain.   1 Inhaler   2   . beta carotene w/minerals (OCUVITE) tablet   Oral   Take 1 tablet by mouth daily.         . budesonide (PULMICORT) 180 MCG/ACT inhaler   Inhalation   Inhale 2 puffs into the lungs 2 (two) times daily.   1 Inhaler   2   . citalopram (CELEXA) 20 MG tablet   Oral   Take 20 mg by mouth daily.         Marland Kitchen  ibuprofen (ADVIL,MOTRIN) 200 MG tablet   Oral   Take 400 mg by mouth 2 (two) times daily as needed. Pain.         . peg 3350 powder (MOVIPREP) 100 G SOLR   Oral   Take 1 kit (100 g total) by mouth as directed.   1 kit   0    BP 83/55  Pulse 86  Temp(Src) 98.8 F (37.1 C) (Oral)  Resp 16  Ht 5\' 2"  (1.575 m)  Wt 130 lb (58.968 kg)  BMI 23.77 kg/m2  SpO2 94% Physical Exam  Nursing note and vitals reviewed. Constitutional: She is oriented to person, place, and time. She appears well-developed and well-nourished. No distress.  HENT:  Head: Normocephalic and atraumatic.  Eyes: Conjunctivae and EOM are normal.  Cardiovascular: Normal rate and regular rhythm.   Pulmonary/Chest: Effort normal and breath sounds normal. No stridor. No respiratory distress. She has no wheezes. She has no rales.  Abdominal: She exhibits no distension.  Musculoskeletal: She exhibits no edema and no tenderness.  Neurological: She is alert and  oriented to person, place, and time. No cranial nerve deficit.  Skin: Skin is warm and dry.  Psychiatric: She has a normal mood and affect.    ED Course  Procedures (including critical care time) Labs Review Labs Reviewed  CBC WITH DIFFERENTIAL - Abnormal; Notable for the following:    WBC 14.3 (*)    Platelets 411 (*)    Neutro Abs 10.1 (*)    Monocytes Absolute 1.4 (*)    All other components within normal limits  BASIC METABOLIC PANEL - Abnormal; Notable for the following:    Sodium 134 (*)    Glucose, Bld 168 (*)    GFR calc non Af Amer 77 (*)    GFR calc Af Amer 90 (*)    All other components within normal limits  TROPONIN I   Imaging Review Dg Chest Portable 1 View  11/28/2012   CLINICAL DATA:  Palpitations; cough. History of smoking and asthma.  EXAM: PORTABLE CHEST - 1 VIEW  COMPARISON:  Chest radiograph performed 11/20/2012  FINDINGS: The lungs are well-aerated. Chronic peribronchial thickening is noted. There is no evidence of focal opacification, pleural effusion or pneumothorax.  The cardiomediastinal silhouette is within normal limits. No acute osseous abnormalities are seen.  IMPRESSION: No acute cardiopulmonary process seen; chronic peribronchial thickening noted.   Electronically Signed   By: Roanna Raider M.D.   On: 11/28/2012 22:44   Cardiac monitor 80, sinus rhythm, normal EKG has sinus rhythm, rate 96, artifacts, otherwise unremarkable Pulse ox 100% room air normal  MDM   1. Palpitations    This patient presents with an episode of palpitations which has resolved.  Given the patient's description of present illness, there is some suspicion for illness-related dehydration.  Patient has no history of atrial fibrillation, but that is what this presentation is most consistent with.  Absent any ongoing complaints and the patient's preference, she was discharged with a follow up with her physician for cardiac monitoring. With no pain, no lightheadedness, no nausea,  there is low suspicion for coronary vasospasm or occult ACS. With the patient's x-ray evidence of possible bronchitis, she was provided additional inhaled steroids    Gerhard Munch, MD 11/28/12 2322

## 2012-11-28 NOTE — ED Notes (Signed)
Pt alert & oriented x4, stable gait. Patient  given discharge instructions, paperwork & prescription(s). Patient verbalized understanding. Pt left department w/ no further questions. 

## 2012-11-28 NOTE — ED Notes (Signed)
Pt with palpitations starting today, denies CP or SOB, admits to being sick with bronchitis for a week now

## 2012-11-28 NOTE — ED Notes (Signed)
Pt reports going to bed tonight & experienced a irregular heart beat. Attempted to call her PCP no return call & then came to the ER. Pt denies any pain, states SOB is as her normal

## 2013-01-12 ENCOUNTER — Other Ambulatory Visit: Payer: Self-pay | Admitting: Internal Medicine

## 2013-01-12 DIAGNOSIS — Z1231 Encounter for screening mammogram for malignant neoplasm of breast: Secondary | ICD-10-CM

## 2013-02-09 ENCOUNTER — Ambulatory Visit: Payer: Medicare Other

## 2013-03-05 ENCOUNTER — Ambulatory Visit
Admission: RE | Admit: 2013-03-05 | Discharge: 2013-03-05 | Disposition: A | Discharge status: Home / Self Care | Payer: Medicare Other | Source: Ambulatory Visit | Attending: Internal Medicine | Admitting: Internal Medicine

## 2013-03-05 DIAGNOSIS — Z1231 Encounter for screening mammogram for malignant neoplasm of breast: Secondary | ICD-10-CM

## 2013-05-29 ENCOUNTER — Encounter: Payer: Self-pay | Admitting: Internal Medicine

## 2013-11-21 ENCOUNTER — Encounter: Payer: Self-pay | Admitting: Orthopedic Surgery

## 2014-02-05 ENCOUNTER — Ambulatory Visit (INDEPENDENT_AMBULATORY_CARE_PROVIDER_SITE_OTHER): Payer: Medicare Other

## 2014-02-05 ENCOUNTER — Encounter: Payer: Self-pay | Admitting: Orthopedic Surgery

## 2014-02-05 ENCOUNTER — Ambulatory Visit (INDEPENDENT_AMBULATORY_CARE_PROVIDER_SITE_OTHER): Payer: Medicare Other | Admitting: Orthopedic Surgery

## 2014-02-05 VITALS — BP 138/72 | Ht 62.0 in | Wt 126.6 lb

## 2014-02-05 DIAGNOSIS — Z96641 Presence of right artificial hip joint: Secondary | ICD-10-CM

## 2014-02-05 NOTE — Patient Instructions (Signed)
Activity as tolerated

## 2014-02-05 NOTE — Progress Notes (Signed)
Patient ID: Rachel [name], female   DOB: Dec 20, 1931, 80 y.o.   MRN: [id number] Chief Complaint  Patient presents with  . Follow-up    yearly recheck + xray Right hip, THA 09/20/06    BP 138/72 mmHg  Ht 5\' 2"  (1.575 m)  Wt 126 lb 9.6 oz (57.425 kg)  BMI 23.15 kg/m2  Encounter Diagnosis  Name Primary?  . History of hip replacement, total, right Yes    8 year postop right total hip showed a Depew metal-on-metal hip  No symptoms no problems  Review of systems negative  Exam vitals are stable appearance is normal she is oriented 3 her mood is pleasant she can walk with no assistive devices she doesn't have any limp her range of motion is excellent her hip is stable she's neurovascularly intact her x-ray shows a stable hip with no evidence of loosening or narrowing of the joint space  Follow-up in a year x-ray

## 2014-03-29 DIAGNOSIS — L821 Other seborrheic keratosis: Secondary | ICD-10-CM | POA: Diagnosis not present

## 2014-03-29 DIAGNOSIS — L820 Inflamed seborrheic keratosis: Secondary | ICD-10-CM | POA: Diagnosis not present

## 2014-03-29 DIAGNOSIS — Z85828 Personal history of other malignant neoplasm of skin: Secondary | ICD-10-CM | POA: Diagnosis not present

## 2014-03-29 DIAGNOSIS — L570 Actinic keratosis: Secondary | ICD-10-CM | POA: Diagnosis not present

## 2014-03-29 DIAGNOSIS — D0439 Carcinoma in situ of skin of other parts of face: Secondary | ICD-10-CM | POA: Diagnosis not present

## 2014-03-29 DIAGNOSIS — D485 Neoplasm of uncertain behavior of skin: Secondary | ICD-10-CM | POA: Diagnosis not present

## 2014-05-23 DIAGNOSIS — D0439 Carcinoma in situ of skin of other parts of face: Secondary | ICD-10-CM | POA: Diagnosis not present

## 2014-05-23 DIAGNOSIS — D485 Neoplasm of uncertain behavior of skin: Secondary | ICD-10-CM | POA: Diagnosis not present

## 2014-09-17 DIAGNOSIS — J3089 Other allergic rhinitis: Secondary | ICD-10-CM | POA: Diagnosis not present

## 2014-09-17 DIAGNOSIS — J3081 Allergic rhinitis due to animal (cat) (dog) hair and dander: Secondary | ICD-10-CM | POA: Diagnosis not present

## 2014-09-17 DIAGNOSIS — J301 Allergic rhinitis due to pollen: Secondary | ICD-10-CM | POA: Diagnosis not present

## 2014-09-17 DIAGNOSIS — J4530 Mild persistent asthma, uncomplicated: Secondary | ICD-10-CM | POA: Diagnosis not present

## 2014-10-18 IMAGING — CR DG CHEST 1V PORT
1 series · 1 of 1 positions shown · non-contrast
Comparison: Chest radiograph performed 11/20/2012

CLINICAL DATA: Palpitations; cough. History of smoking and asthma.

EXAM:
PORTABLE CHEST - 1 VIEW

[portable]
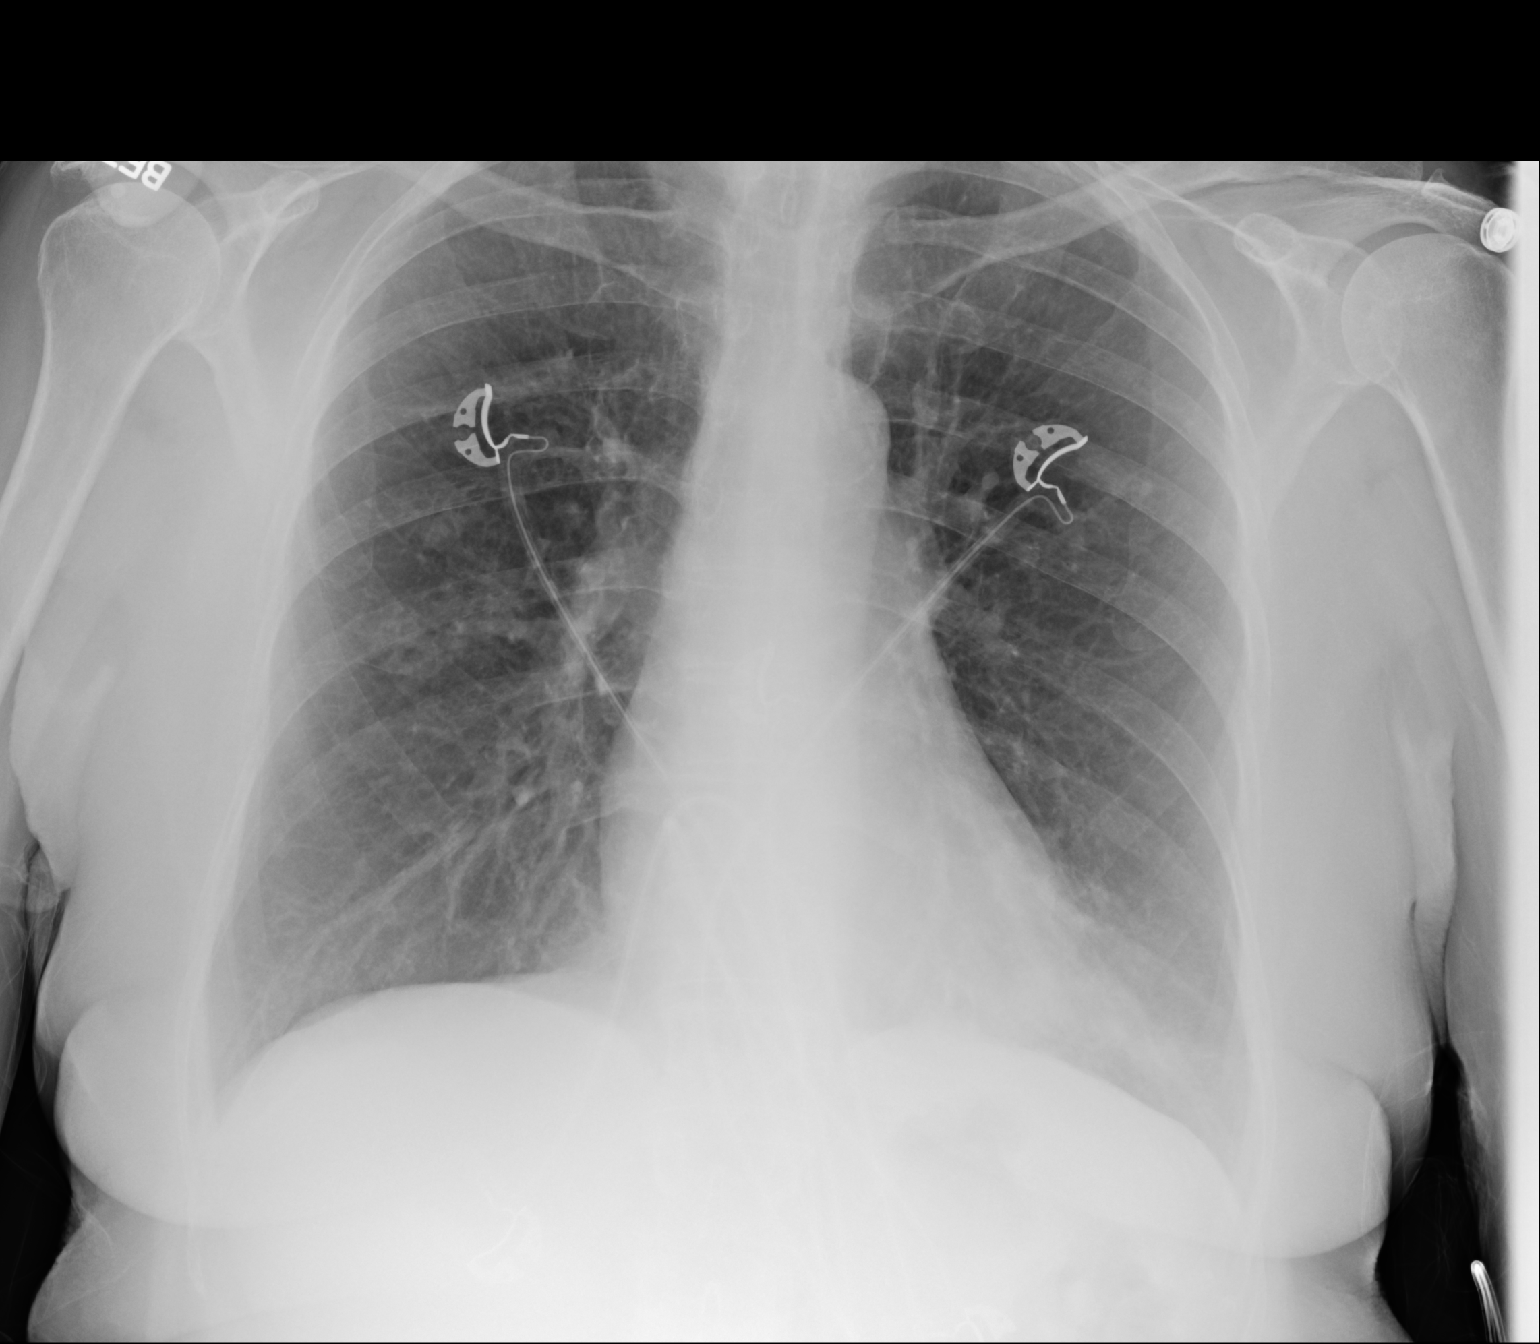

[1 of 1 positions shown; findings below may reference images not displayed]

FINDINGS: The lungs are well-aerated. Chronic peribronchial thickening is
noted. There is no evidence of focal opacification, pleural effusion
or pneumothorax.

The cardiomediastinal silhouette is within normal limits. No acute
osseous abnormalities are seen.
IMPRESSION: No acute cardiopulmonary process seen; chronic peribronchial
thickening noted.

## 2014-11-07 DIAGNOSIS — Z23 Encounter for immunization: Secondary | ICD-10-CM | POA: Diagnosis not present

## 2014-11-15 DIAGNOSIS — J3089 Other allergic rhinitis: Secondary | ICD-10-CM | POA: Diagnosis not present

## 2014-11-15 DIAGNOSIS — J301 Allergic rhinitis due to pollen: Secondary | ICD-10-CM | POA: Diagnosis not present

## 2014-11-15 DIAGNOSIS — J3081 Allergic rhinitis due to animal (cat) (dog) hair and dander: Secondary | ICD-10-CM | POA: Diagnosis not present

## 2014-11-22 DIAGNOSIS — J3081 Allergic rhinitis due to animal (cat) (dog) hair and dander: Secondary | ICD-10-CM | POA: Diagnosis not present

## 2014-11-22 DIAGNOSIS — J3089 Other allergic rhinitis: Secondary | ICD-10-CM | POA: Diagnosis not present

## 2014-11-22 DIAGNOSIS — J301 Allergic rhinitis due to pollen: Secondary | ICD-10-CM | POA: Diagnosis not present

## 2014-11-28 DIAGNOSIS — Z85828 Personal history of other malignant neoplasm of skin: Secondary | ICD-10-CM | POA: Diagnosis not present

## 2014-11-28 DIAGNOSIS — C44319 Basal cell carcinoma of skin of other parts of face: Secondary | ICD-10-CM | POA: Diagnosis not present

## 2014-11-28 DIAGNOSIS — L570 Actinic keratosis: Secondary | ICD-10-CM | POA: Diagnosis not present

## 2014-11-28 DIAGNOSIS — L821 Other seborrheic keratosis: Secondary | ICD-10-CM | POA: Diagnosis not present

## 2014-11-28 DIAGNOSIS — D485 Neoplasm of uncertain behavior of skin: Secondary | ICD-10-CM | POA: Diagnosis not present

## 2014-11-28 DIAGNOSIS — L812 Freckles: Secondary | ICD-10-CM | POA: Diagnosis not present

## 2015-01-01 DIAGNOSIS — C44319 Basal cell carcinoma of skin of other parts of face: Secondary | ICD-10-CM | POA: Diagnosis not present

## 2015-01-24 DIAGNOSIS — J45909 Unspecified asthma, uncomplicated: Secondary | ICD-10-CM | POA: Diagnosis not present

## 2015-01-24 DIAGNOSIS — M1990 Unspecified osteoarthritis, unspecified site: Secondary | ICD-10-CM | POA: Diagnosis not present

## 2015-01-24 DIAGNOSIS — Z79899 Other long term (current) drug therapy: Secondary | ICD-10-CM | POA: Diagnosis not present

## 2015-01-31 DIAGNOSIS — Z23 Encounter for immunization: Secondary | ICD-10-CM | POA: Diagnosis not present

## 2015-01-31 DIAGNOSIS — J4520 Mild intermittent asthma, uncomplicated: Secondary | ICD-10-CM | POA: Diagnosis not present

## 2015-01-31 DIAGNOSIS — Z6823 Body mass index (BMI) 23.0-23.9, adult: Secondary | ICD-10-CM | POA: Diagnosis not present

## 2015-01-31 DIAGNOSIS — E785 Hyperlipidemia, unspecified: Secondary | ICD-10-CM | POA: Diagnosis not present

## 2015-01-31 DIAGNOSIS — Z0001 Encounter for general adult medical examination with abnormal findings: Secondary | ICD-10-CM | POA: Diagnosis not present

## 2015-02-27 ENCOUNTER — Other Ambulatory Visit: Payer: Self-pay | Admitting: Podiatry

## 2015-03-27 NOTE — Patient Instructions (Signed)
    Rachel [name]  03/27/2015     @PREFPERIOPPHARMACY @   Your procedure is scheduled on 04/01/15  Report to [facility] at [place].M.  Call this number if you have problems the morning of surgery:  [phone]   Remember:  Do not eat food or drink liquids after midnight.  Take these medicines the morning of surgery with A SIP OF WATER: CELEXA. USE ALBUTEROL INHALER & BRING IT WITH YOU.   Do not wear jewelry, make-up or nail polish.  Do not wear lotions, powders, or perfumes.  You may wear deodorant.  Do not shave 48 hours prior to surgery.  Men may shave face and neck.  Do not bring valuables to the hospital.  [facility] is not responsible for any belongings or valuables.  Contacts, dentures or bridgework may not be worn into surgery.  Leave your suitcase in the car.  After surgery it may be brought to your room.  For patients admitted to the hospital, discharge time will be determined by your treatment team.  Patients discharged the day of surgery will not be allowed to drive home.   Name and phone number of your driver:   FAMILY Special instructions:    Please read over the following fact sheets that you were given. Anesthesia Post-op Instructions and Care and Recovery After Surgery   PATIENT INSTRUCTIONS POST-ANESTHESIA  IMMEDIATELY FOLLOWING SURGERY:  Do not drive or operate machinery for the first twenty four hours after surgery.  Do not make any important decisions for twenty four hours after surgery or while taking narcotic pain medications or sedatives.  If you develop intractable nausea and vomiting or a severe headache please notify your doctor immediately.  FOLLOW-UP:  Please make an appointment with your surgeon as instructed. You do not need to follow up with anesthesia unless specifically instructed to do so.  WOUND CARE INSTRUCTIONS (if applicable):  Keep a dry clean dressing on the anesthesia/puncture wound site if there is drainage.  Once the wound has quit  draining you may leave it open to air.  Generally you should leave the bandage intact for twenty four hours unless there is drainage.  If the epidural site drains for more than 36-48 hours please call the anesthesia department.  QUESTIONS?:  Please feel free to call your physician or the hospital operator if you have any questions, and they will be happy to assist you.

## 2015-03-28 ENCOUNTER — Encounter (HOSPITAL_COMMUNITY): Payer: Self-pay

## 2015-03-28 ENCOUNTER — Other Ambulatory Visit: Payer: Self-pay

## 2015-03-28 ENCOUNTER — Encounter (HOSPITAL_COMMUNITY)
Admission: RE | Admit: 2015-03-28 | Discharge: 2015-03-28 | Disposition: A | Discharge status: Home / Self Care | Payer: Medicare Other | Source: Ambulatory Visit | Attending: Podiatry | Admitting: Podiatry

## 2015-03-28 DIAGNOSIS — M7742 Metatarsalgia, left foot: Secondary | ICD-10-CM | POA: Insufficient documentation

## 2015-03-28 DIAGNOSIS — Z79899 Other long term (current) drug therapy: Secondary | ICD-10-CM | POA: Diagnosis not present

## 2015-03-28 DIAGNOSIS — Z01810 Encounter for preprocedural cardiovascular examination: Secondary | ICD-10-CM | POA: Insufficient documentation

## 2015-03-28 DIAGNOSIS — M79672 Pain in left foot: Secondary | ICD-10-CM | POA: Diagnosis not present

## 2015-03-28 DIAGNOSIS — Z01812 Encounter for preprocedural laboratory examination: Secondary | ICD-10-CM | POA: Diagnosis not present

## 2015-03-28 DIAGNOSIS — M2042 Other hammer toe(s) (acquired), left foot: Secondary | ICD-10-CM | POA: Diagnosis not present

## 2015-03-28 LAB — BASIC METABOLIC PANEL
Anion gap: 8 (ref 5–15)
BUN: 26 mg/dL — AB (ref 6–20)
CALCIUM: 9.2 mg/dL (ref 8.9–10.3)
CHLORIDE: 104 mmol/L (ref 101–111)
CO2: 27 mmol/L (ref 22–32)
CREATININE: 1.04 mg/dL — AB (ref 0.44–1.00)
GFR calc Af Amer: 56 mL/min — ABNORMAL LOW (ref >60)
GFR, EST NON AFRICAN AMERICAN: 49 mL/min — AB (ref >60)
Glucose, Bld: 116 mg/dL — ABNORMAL HIGH (ref 65–99)
POTASSIUM: 4 mmol/L (ref 3.5–5.1)
Sodium: 139 mmol/L (ref 135–145)

## 2015-03-28 LAB — CBC WITH DIFFERENTIAL/PLATELET
Basophils Absolute: 0.1 10*3/uL (ref 0.0–0.1)
Basophils Relative: 1 %
EOS PCT: 2 %
Eosinophils Absolute: 0.2 10*3/uL (ref 0.0–0.7)
HCT: 42.2 % (ref 36.0–46.0)
HEMOGLOBIN: 14.1 g/dL (ref 12.0–15.0)
LYMPHS ABS: 3.3 10*3/uL (ref 0.7–4.0)
LYMPHS PCT: 40 %
MCH: 30.5 pg (ref 26.0–34.0)
MCHC: 33.4 g/dL (ref 30.0–36.0)
MCV: 91.1 fL (ref 78.0–100.0)
MONOS PCT: 11 %
Monocytes Absolute: 0.9 10*3/uL (ref 0.1–1.0)
NEUTROS PCT: 46 %
Neutro Abs: 3.9 10*3/uL (ref 1.7–7.7)
Platelets: 227 10*3/uL (ref 150–400)
RBC: 4.63 MIL/uL (ref 3.87–5.11)
RDW: 13.9 % (ref 11.5–15.5)
WBC: 8.3 10*3/uL (ref 4.0–10.5)

## 2015-04-01 ENCOUNTER — Encounter (HOSPITAL_COMMUNITY): Payer: Self-pay | Admitting: *Deleted

## 2015-04-01 ENCOUNTER — Ambulatory Visit (HOSPITAL_COMMUNITY)
Admission: RE | Admit: 2015-04-01 | Discharge: 2015-04-01 | Disposition: A | Discharge status: Home / Self Care | Payer: Medicare Other | Source: Ambulatory Visit | Attending: Podiatry | Admitting: Podiatry

## 2015-04-01 ENCOUNTER — Ambulatory Visit (HOSPITAL_COMMUNITY): Payer: Medicare Other | Admitting: Anesthesiology

## 2015-04-01 ENCOUNTER — Ambulatory Visit (HOSPITAL_COMMUNITY): Payer: Medicare Other

## 2015-04-01 ENCOUNTER — Encounter (HOSPITAL_COMMUNITY)
Admission: RE | Disposition: A | Discharge status: Home / Self Care | Payer: Self-pay | Source: Ambulatory Visit | Attending: Podiatry

## 2015-04-01 DIAGNOSIS — M94272 Chondromalacia, left ankle and joints of left foot: Secondary | ICD-10-CM | POA: Diagnosis not present

## 2015-04-01 DIAGNOSIS — M2042 Other hammer toe(s) (acquired), left foot: Secondary | ICD-10-CM | POA: Diagnosis not present

## 2015-04-01 DIAGNOSIS — M1991 Primary osteoarthritis, unspecified site: Secondary | ICD-10-CM | POA: Insufficient documentation

## 2015-04-01 DIAGNOSIS — J45909 Unspecified asthma, uncomplicated: Secondary | ICD-10-CM | POA: Diagnosis not present

## 2015-04-01 DIAGNOSIS — F419 Anxiety disorder, unspecified: Secondary | ICD-10-CM | POA: Diagnosis not present

## 2015-04-01 DIAGNOSIS — M7742 Metatarsalgia, left foot: Secondary | ICD-10-CM | POA: Insufficient documentation

## 2015-04-01 DIAGNOSIS — S98132A Complete traumatic amputation of one left lesser toe, initial encounter: Secondary | ICD-10-CM | POA: Diagnosis not present

## 2015-04-01 DIAGNOSIS — M25572 Pain in left ankle and joints of left foot: Secondary | ICD-10-CM | POA: Diagnosis not present

## 2015-04-01 DIAGNOSIS — Z79899 Other long term (current) drug therapy: Secondary | ICD-10-CM | POA: Insufficient documentation

## 2015-04-01 DIAGNOSIS — Z9889 Other specified postprocedural states: Secondary | ICD-10-CM

## 2015-04-01 DIAGNOSIS — Z87891 Personal history of nicotine dependence: Secondary | ICD-10-CM | POA: Insufficient documentation

## 2015-04-01 HISTORY — PX: AMPUTATION: SHX166

## 2015-04-01 SURGERY — AMPUTATION, FOOT, RAY
Anesthesia: Monitor Anesthesia Care | Site: Foot | Laterality: Left

## 2015-04-01 MED ORDER — SUCCINYLCHOLINE CHLORIDE 20 MG/ML IJ SOLN
INTRAMUSCULAR | Status: AC
  Filled 2015-04-01: qty 1

## 2015-04-01 MED ORDER — FENTANYL CITRATE (PF) 100 MCG/2ML IJ SOLN
INTRAMUSCULAR | Status: AC
  Filled 2015-04-01: qty 2

## 2015-04-01 MED ORDER — FENTANYL CITRATE (PF) 100 MCG/2ML IJ SOLN: 25.0000 ug | INTRAMUSCULAR | Status: DC | PRN

## 2015-04-01 MED ORDER — LACTATED RINGERS IV SOLN
INTRAVENOUS | Status: DC
  Administered 2015-04-01: 1000 mL via INTRAVENOUS

## 2015-04-01 MED ORDER — FENTANYL CITRATE (PF) 100 MCG/2ML IJ SOLN
25.0000 ug | INTRAMUSCULAR | Status: AC
  Administered 2015-04-01 (×2): 25 ug via INTRAVENOUS

## 2015-04-01 MED ORDER — CEFAZOLIN SODIUM-DEXTROSE 2-3 GM-% IV SOLR
INTRAVENOUS | Status: AC
  Filled 2015-04-01: qty 50

## 2015-04-01 MED ORDER — ONDANSETRON HCL 4 MG/2ML IJ SOLN
4.0000 mg | Freq: Once | INTRAMUSCULAR | Status: AC
  Administered 2015-04-01: 4 mg via INTRAVENOUS

## 2015-04-01 MED ORDER — MIDAZOLAM HCL 2 MG/2ML IJ SOLN
1.0000 mg | INTRAMUSCULAR | Status: DC | PRN
  Administered 2015-04-01: 2 mg via INTRAVENOUS

## 2015-04-01 MED ORDER — MIDAZOLAM HCL 2 MG/2ML IJ SOLN
INTRAMUSCULAR | Status: AC
  Filled 2015-04-01: qty 2

## 2015-04-01 MED ORDER — CEFAZOLIN SODIUM-DEXTROSE 2-3 GM-% IV SOLR
2.0000 g | INTRAVENOUS | Status: AC
  Administered 2015-04-01: 2 g via INTRAVENOUS

## 2015-04-01 MED ORDER — BUPIVACAINE HCL (PF) 0.5 % IJ SOLN
INTRAMUSCULAR | Status: DC | PRN
  Administered 2015-04-01: 16 mL

## 2015-04-01 MED ORDER — MIDAZOLAM HCL 5 MG/5ML IJ SOLN
INTRAMUSCULAR | Status: DC | PRN
  Administered 2015-04-01 (×2): 1 mg via INTRAVENOUS

## 2015-04-01 MED ORDER — LIDOCAINE HCL (PF) 1 % IJ SOLN
INTRAMUSCULAR | Status: AC
  Filled 2015-04-01: qty 5

## 2015-04-01 MED ORDER — ONDANSETRON HCL 4 MG/2ML IJ SOLN
INTRAMUSCULAR | Status: AC
  Filled 2015-04-01: qty 2

## 2015-04-01 MED ORDER — BUPIVACAINE HCL (PF) 0.5 % IJ SOLN
INTRAMUSCULAR | Status: AC
  Filled 2015-04-01: qty 30

## 2015-04-01 MED ORDER — ONDANSETRON HCL 4 MG/2ML IJ SOLN: 4.0000 mg | Freq: Once | INTRAMUSCULAR | Status: DC | PRN

## 2015-04-01 MED ORDER — PROPOFOL 500 MG/50ML IV EMUL
INTRAVENOUS | Status: DC | PRN
  Administered 2015-04-01: 25 ug/kg/min via INTRAVENOUS

## 2015-04-01 MED ORDER — FENTANYL CITRATE (PF) 100 MCG/2ML IJ SOLN
INTRAMUSCULAR | Status: DC | PRN
  Administered 2015-04-01 (×4): 25 ug via INTRAVENOUS

## 2015-04-01 MED ORDER — 0.9 % SODIUM CHLORIDE (POUR BTL) OPTIME
TOPICAL | Status: DC | PRN
  Administered 2015-04-01: 1000 mL

## 2015-04-01 MED ORDER — PROPOFOL 10 MG/ML IV BOLUS
INTRAVENOUS | Status: AC
  Filled 2015-04-01: qty 20

## 2015-04-01 SURGICAL SUPPLY — 53 items
APL SKNCLS STERI-STRIP NONHPOA (GAUZE/BANDAGES/DRESSINGS) ×1
BAG HAMPER (MISCELLANEOUS) ×3 IMPLANT
BANDAGE ELASTIC 4 VELCRO NS (GAUZE/BANDAGES/DRESSINGS) ×3 IMPLANT
BANDAGE ESMARK 4X12 BL STRL LF (DISPOSABLE) ×1 IMPLANT
BENZOIN TINCTURE PRP APPL 2/3 (GAUZE/BANDAGES/DRESSINGS) ×3 IMPLANT
BLADE AVERAGE 25MMX9MM (BLADE) ×1
BLADE AVERAGE 25X9 (BLADE) ×2 IMPLANT
BLADE SURG 15 STRL LF DISP TIS (BLADE) ×1 IMPLANT
BLADE SURG 15 STRL SS (BLADE) ×3
BNDG CMPR 12X4 ELC STRL LF (DISPOSABLE) ×1
BNDG CONFORM 2 STRL LF (GAUZE/BANDAGES/DRESSINGS) ×3 IMPLANT
BNDG ESMARK 4X12 BLUE STRL LF (DISPOSABLE) ×3
BNDG GAUZE ELAST 4 BULKY (GAUZE/BANDAGES/DRESSINGS) ×3 IMPLANT
BOOT STEPPER DURA LG (SOFTGOODS) IMPLANT
BOOT STEPPER DURA MED (SOFTGOODS) IMPLANT
BOOT STEPPER DURA SM (SOFTGOODS) IMPLANT
BOOT STEPPER DURA XLG (SOFTGOODS) IMPLANT
CLOSURE WOUND 1/2 X4 (GAUZE/BANDAGES/DRESSINGS) ×1
CLOTH BEACON ORANGE TIMEOUT ST (SAFETY) ×3 IMPLANT
COVER LIGHT HANDLE STERIS (MISCELLANEOUS) ×6 IMPLANT
CUFF TOURNIQUET SINGLE 18IN (TOURNIQUET CUFF) ×3 IMPLANT
DECANTER SPIKE VIAL GLASS SM (MISCELLANEOUS) ×3 IMPLANT
DRAPE OEC MINIVIEW 54X84 (DRAPES) ×3 IMPLANT
ELECT REM PT RETURN 9FT ADLT (ELECTROSURGICAL) ×3
ELECTRODE REM PT RTRN 9FT ADLT (ELECTROSURGICAL) ×1 IMPLANT
FORMALIN 10 PREFIL 120ML (MISCELLANEOUS) ×3 IMPLANT
GAUZE KERLIX 2  STERILE LF (GAUZE/BANDAGES/DRESSINGS) ×2 IMPLANT
GAUZE SPONGE 4X4 12PLY STRL (GAUZE/BANDAGES/DRESSINGS) ×3 IMPLANT
GLOVE BIO SURGEON STRL SZ7.5 (GLOVE) ×3 IMPLANT
GLOVE BIOGEL PI IND STRL 7.0 (GLOVE) ×4 IMPLANT
GLOVE BIOGEL PI INDICATOR 7.0 (GLOVE) ×8
GLOVE ECLIPSE 6.5 STRL STRAW (GLOVE) ×4 IMPLANT
GLOVE ECLIPSE 7.0 STRL STRAW (GLOVE) ×3 IMPLANT
GOWN STRL REUS W/TWL LRG LVL3 (GOWN DISPOSABLE) ×6 IMPLANT
KIT ROOM TURNOVER APOR (KITS) ×3 IMPLANT
MARKER SKIN DUAL TIP RULER LAB (MISCELLANEOUS) ×3 IMPLANT
NDL HYPO 27GX1-1/4 (NEEDLE) ×4 IMPLANT
NEEDLE HYPO 27GX1-1/4 (NEEDLE) ×6 IMPLANT
NS IRRIG 1000ML POUR BTL (IV SOLUTION) ×3 IMPLANT
PACK BASIC LIMB (CUSTOM PROCEDURE TRAY) ×3 IMPLANT
PAD ARMBOARD 7.5X6 YLW CONV (MISCELLANEOUS) ×3 IMPLANT
PAD TELFA 3X4 1S STER (GAUZE/BANDAGES/DRESSINGS) ×1 IMPLANT
RASP SM TEAR CROSS CUT (RASP) IMPLANT
SET BASIN LINEN APH (SET/KITS/TRAYS/PACK) ×3 IMPLANT
SOL PREP PROV IODINE SCRUB 4OZ (MISCELLANEOUS) ×1 IMPLANT
SPONGE GAUZE 4X4 12PLY (GAUZE/BANDAGES/DRESSINGS) ×2 IMPLANT
SPONGE LAP 18X18 X RAY DECT (DISPOSABLE) ×3 IMPLANT
STRIP CLOSURE SKIN 1/2X4 (GAUZE/BANDAGES/DRESSINGS) ×3 IMPLANT
SUT BONE WAX W31G (SUTURE) IMPLANT
SUT PROLENE 4 0 PS 2 18 (SUTURE) ×6 IMPLANT
SUT VIC AB 4-0 PS2 27 (SUTURE) ×3 IMPLANT
SYR CONTROL 10ML LL (SYRINGE) ×6 IMPLANT
TOWEL OR 17X26 4PK STRL BLUE (TOWEL DISPOSABLE) ×3 IMPLANT

## 2015-04-01 NOTE — Anesthesia Postprocedure Evaluation (Signed)
Anesthesia Post Note  Patient: Rachel [name]  Procedure(s) Performed: Procedure(s) (LRB): PARTIAL SECOND RAY AMPUATATION OF LEFT FOOT (AMPUTATION OF TOE AND PORTION OF SECOND METATARSAL) (Left)  Patient location during evaluation: PACU Anesthesia Type: MAC Level of consciousness: awake and alert and oriented Pain management: pain level controlled Vital Signs Assessment: post-procedure vital signs reviewed and stable Respiratory status: spontaneous breathing, respiratory function stable and patient connected to nasal cannula oxygen Cardiovascular status: stable Postop Assessment: no signs of nausea or vomiting Anesthetic complications: no    Last Vitals:  Filed Vitals:   04/01/15 0720 04/01/15 0725  BP: 126/72 125/66  Temp:    Resp: 32 23    Last Pain: There were no vitals filed for this visit.               Daveah Varone A

## 2015-04-01 NOTE — Transfer of Care (Signed)
Immediate Anesthesia Transfer of Care Note  Patient: Rachel [name]  Procedure(s) Performed: Procedure(s): PARTIAL SECOND RAY AMPUATATION OF LEFT FOOT (AMPUTATION OF TOE AND PORTION OF SECOND METATARSAL) (Left)  Patient Location: PACU  Anesthesia Type:MAC  Level of Consciousness: awake, alert , oriented and patient cooperative  Airway & Oxygen Therapy: Patient Spontanous Breathing and Patient connected to nasal cannula oxygen  Post-op Assessment: Report given to RN and Post -op Vital signs reviewed and stable  Post vital signs: Reviewed and stable  Last Vitals:  Filed Vitals:   04/01/15 0720 04/01/15 0725  BP: 126/72 125/66  Temp:    Resp: 32 23    Complications: No apparent anesthesia complications

## 2015-04-01 NOTE — Op Note (Signed)
OPERATIVE NOTE  DATE OF PROCEDURE 04/01/2015  SURGEON Marcheta Grammes, [place]  OR STAFF Circulator: Sue Lush, RN Scrub Person: Lucie Leather, CST RN First Assistant: Towanda Malkin, RN   PREOPERATIVE DIAGNOSIS 1.  Hammer toe of the 2nd toe, left foot 2.  Metatarsalgia, left foot  POSTOPERATIVE DIAGNOSIS Same  PROCEDURE Partial 2nd ray amputation, left foot  ANESTHESIA Monitor Anesthesia Care   HEMOSTASIS Pneumatic ankle tourniquet set at 250 mmHg  ESTIMATED BLOOD LOSS Minimal (<5 cc)  MATERIALS USED None  INJECTABLES 0.5% Marcaine plain  PATHOLOGY Partial 2nd ray amputation, left foot  COMPLICATIONS None  INDICATIONS:  Painful hammertoe deformity of the second digit of the left foot.  Pain along the plantar aspect of the second metatarsal head of the left foot.  DESCRIPTION OF THE PROCEDURE:  The patient was brought to the operating room and placed on the operative table in the supine position.  A pneumatic ankle tourniquet was applied to the operative extremity.  Following sedation, the surgical site was anesthetized with 0.5% Marcaine plain.  The foot was then prepped, scrubbed, and draped in the usual sterile technique.  The foot was elevated, exsanguinated and the pneumatic ankle tourniquet inflated to 250 mmHg.    Attention was directed to the dorsal aspect of the left second toe.  2 converging semielliptical incisions were made encompassing the toe circumferentially.  Dissection was continued deep to the level of the second MPJ.  The second MPJ was disarticulated and the toe was passed from the operative field.  The skin incision was extended proximally and dorsally.  Dissection was continued deep down to the level of the second metatarsal.  A linear periosteal incision was made just proximal to the surgical neck.  The periosteal structures were reflected medially and laterally.  A power bone saw was used to perform an osteotomy through the second  metatarsal.  The head of the second metatarsal was freed of all soft tissue attachments and passed from the operative field.  The wound was irrigated with copious amounts of sterile irrigant.  The subcutaneous structures were reapproximated using 4-0 Vicryl.  The skin was reapproximated using 4-0 Prolene.  A sterile compressive dressing was applied to the left foot.   The patient tolerated the procedure well.  The patient was then transferred to PACU with vital signs stable and vascular status intact to all toes of the operative foot.

## 2015-04-01 NOTE — Anesthesia Procedure Notes (Signed)
Procedure Name: MAC Date/Time: 04/01/2015 7:28 AM Performed by: Andree Elk, AMY A Pre-anesthesia Checklist: Patient identified, Timeout performed, Emergency Drugs available, Suction available and Patient being monitored Oxygen Delivery Method: Simple face mask

## 2015-04-01 NOTE — Brief Op Note (Signed)
BRIEF OPERATIVE NOTE  DATE OF PROCEDURE 04/01/2015  SURGEON Marcheta Grammes, [place]  OR STAFF Circulator: Sue Lush, RN Scrub Person: Lucie Leather, CST RN First Assistant: Towanda Malkin, RN   PREOPERATIVE DIAGNOSIS 1.  Hammer toe of the 2nd toe, left foot 2.  Metatarsalgia, left foot  POSTOPERATIVE DIAGNOSIS Same  PROCEDURE Partial 2nd ray amputation, left foot  ANESTHESIA Monitor Anesthesia Care   HEMOSTASIS Pneumatic ankle tourniquet set at 250 mmHg  ESTIMATED BLOOD LOSS Minimal (<5 cc)  MATERIALS USED None  INJECTABLES 0.5% Marcaine plain  PATHOLOGY Partial 2nd ray amputation, left foot  COMPLICATIONS None

## 2015-04-01 NOTE — Discharge Instructions (Signed)

## 2015-04-01 NOTE — H&P (Signed)
HISTORY AND PHYSICAL INTERVAL NOTE:  04/01/2015  7:18 AM  Rachel [name]  has presented today for surgery, with the diagnosis of hammer toe, metatarsalgia.  The various methods of treatment have been discussed with the patient.  No guarantees were given.  After consideration of risks, benefits and other options for treatment, the patient has consented to surgery.  I have reviewed the patients' chart and labs.    Patient Vitals for the past 24 hrs:  BP Temp Temp src Resp SpO2  04/01/15 0647 (!) 123/58 mmHg 97.5 F (36.4 C) Oral 20 96 %  04/01/15 0645 (!) 123/58 mmHg - - (!) 39 94 %    A history and physical examination was performed in my office.  The patient was reexamined.  There have been no changes to this history and physical examination.  Marcheta Grammes, DPM

## 2015-04-01 NOTE — Anesthesia Preprocedure Evaluation (Signed)
Anesthesia Evaluation  Patient identified by MRN, date of birth, ID band Patient awake    Reviewed: Allergy & Precautions, NPO status , Patient's Chart, lab work & pertinent test results  Airway Mallampati: I  TM Distance: >3 FB     Dental  (+) Teeth Intact   Pulmonary asthma , former smoker,    breath sounds clear to auscultation       Cardiovascular negative cardio ROS   Rhythm:Regular Rate:Normal     Neuro/Psych Anxiety    GI/Hepatic negative GI ROS,   Endo/Other    Renal/GU      Musculoskeletal  (+) Arthritis ,   Abdominal   Peds  Hematology   Anesthesia Other Findings   Reproductive/Obstetrics                             Anesthesia Physical Anesthesia Plan  ASA: II  Anesthesia Plan: MAC   Post-op Pain Management:    Induction: Intravenous  Airway Management Planned: Nasal Cannula  Additional Equipment:   Intra-op Plan:   Post-operative Plan:   Informed Consent: I have reviewed the patients History and Physical, chart, labs and discussed the procedure including the risks, benefits and alternatives for the proposed anesthesia with the patient or authorized representative who has indicated his/her understanding and acceptance.     Plan Discussed with:   Anesthesia Plan Comments:         Anesthesia Quick Evaluation

## 2015-04-02 ENCOUNTER — Encounter (HOSPITAL_COMMUNITY): Payer: Self-pay | Admitting: Podiatry

## 2015-05-22 DIAGNOSIS — L812 Freckles: Secondary | ICD-10-CM | POA: Diagnosis not present

## 2015-05-22 DIAGNOSIS — Z85828 Personal history of other malignant neoplasm of skin: Secondary | ICD-10-CM | POA: Diagnosis not present

## 2015-05-22 DIAGNOSIS — L821 Other seborrheic keratosis: Secondary | ICD-10-CM | POA: Diagnosis not present

## 2015-05-22 DIAGNOSIS — L570 Actinic keratosis: Secondary | ICD-10-CM | POA: Diagnosis not present

## 2015-05-22 DIAGNOSIS — L111 Transient acantholytic dermatosis [Grover]: Secondary | ICD-10-CM | POA: Diagnosis not present

## 2015-08-29 DIAGNOSIS — J301 Allergic rhinitis due to pollen: Secondary | ICD-10-CM | POA: Diagnosis not present

## 2015-08-29 DIAGNOSIS — J3089 Other allergic rhinitis: Secondary | ICD-10-CM | POA: Diagnosis not present

## 2015-08-29 DIAGNOSIS — J3081 Allergic rhinitis due to animal (cat) (dog) hair and dander: Secondary | ICD-10-CM | POA: Diagnosis not present

## 2015-09-01 DIAGNOSIS — J301 Allergic rhinitis due to pollen: Secondary | ICD-10-CM | POA: Diagnosis not present

## 2015-09-01 DIAGNOSIS — J3081 Allergic rhinitis due to animal (cat) (dog) hair and dander: Secondary | ICD-10-CM | POA: Diagnosis not present

## 2015-09-01 DIAGNOSIS — J3089 Other allergic rhinitis: Secondary | ICD-10-CM | POA: Diagnosis not present

## 2015-09-17 DIAGNOSIS — J301 Allergic rhinitis due to pollen: Secondary | ICD-10-CM | POA: Diagnosis not present

## 2015-09-17 DIAGNOSIS — J4530 Mild persistent asthma, uncomplicated: Secondary | ICD-10-CM | POA: Diagnosis not present

## 2015-09-17 DIAGNOSIS — J3089 Other allergic rhinitis: Secondary | ICD-10-CM | POA: Diagnosis not present

## 2015-09-17 DIAGNOSIS — J3081 Allergic rhinitis due to animal (cat) (dog) hair and dander: Secondary | ICD-10-CM | POA: Diagnosis not present

## 2015-10-28 DIAGNOSIS — H25813 Combined forms of age-related cataract, bilateral: Secondary | ICD-10-CM | POA: Diagnosis not present

## 2015-10-28 DIAGNOSIS — H524 Presbyopia: Secondary | ICD-10-CM | POA: Diagnosis not present

## 2015-10-28 DIAGNOSIS — H5203 Hypermetropia, bilateral: Secondary | ICD-10-CM | POA: Diagnosis not present

## 2015-10-28 DIAGNOSIS — H52223 Regular astigmatism, bilateral: Secondary | ICD-10-CM | POA: Diagnosis not present

## 2015-11-18 DIAGNOSIS — Z23 Encounter for immunization: Secondary | ICD-10-CM | POA: Diagnosis not present

## 2015-12-12 DIAGNOSIS — H3530 Unspecified macular degeneration: Secondary | ICD-10-CM | POA: Diagnosis not present

## 2016-02-20 DIAGNOSIS — E785 Hyperlipidemia, unspecified: Secondary | ICD-10-CM | POA: Diagnosis not present

## 2016-02-20 DIAGNOSIS — Z79899 Other long term (current) drug therapy: Secondary | ICD-10-CM | POA: Diagnosis not present

## 2016-02-20 DIAGNOSIS — M1990 Unspecified osteoarthritis, unspecified site: Secondary | ICD-10-CM | POA: Diagnosis not present

## 2016-02-20 DIAGNOSIS — J45909 Unspecified asthma, uncomplicated: Secondary | ICD-10-CM | POA: Diagnosis not present

## 2016-02-20 DIAGNOSIS — M810 Age-related osteoporosis without current pathological fracture: Secondary | ICD-10-CM | POA: Diagnosis not present

## 2016-02-27 DIAGNOSIS — Z0001 Encounter for general adult medical examination with abnormal findings: Secondary | ICD-10-CM | POA: Diagnosis not present

## 2016-02-27 DIAGNOSIS — M1990 Unspecified osteoarthritis, unspecified site: Secondary | ICD-10-CM | POA: Diagnosis not present

## 2016-02-27 DIAGNOSIS — E785 Hyperlipidemia, unspecified: Secondary | ICD-10-CM | POA: Diagnosis not present

## 2016-02-27 DIAGNOSIS — Z6822 Body mass index (BMI) 22.0-22.9, adult: Secondary | ICD-10-CM | POA: Diagnosis not present

## 2016-05-20 DIAGNOSIS — D485 Neoplasm of uncertain behavior of skin: Secondary | ICD-10-CM | POA: Diagnosis not present

## 2016-05-20 DIAGNOSIS — J3089 Other allergic rhinitis: Secondary | ICD-10-CM | POA: Diagnosis not present

## 2016-05-20 DIAGNOSIS — L814 Other melanin hyperpigmentation: Secondary | ICD-10-CM | POA: Diagnosis not present

## 2016-05-20 DIAGNOSIS — D1801 Hemangioma of skin and subcutaneous tissue: Secondary | ICD-10-CM | POA: Diagnosis not present

## 2016-05-20 DIAGNOSIS — L821 Other seborrheic keratosis: Secondary | ICD-10-CM | POA: Diagnosis not present

## 2016-05-20 DIAGNOSIS — J3081 Allergic rhinitis due to animal (cat) (dog) hair and dander: Secondary | ICD-10-CM | POA: Diagnosis not present

## 2016-05-20 DIAGNOSIS — L570 Actinic keratosis: Secondary | ICD-10-CM | POA: Diagnosis not present

## 2016-05-20 DIAGNOSIS — L439 Lichen planus, unspecified: Secondary | ICD-10-CM | POA: Diagnosis not present

## 2016-05-20 DIAGNOSIS — J301 Allergic rhinitis due to pollen: Secondary | ICD-10-CM | POA: Diagnosis not present

## 2016-06-04 DIAGNOSIS — J301 Allergic rhinitis due to pollen: Secondary | ICD-10-CM | POA: Diagnosis not present

## 2016-06-04 DIAGNOSIS — J3081 Allergic rhinitis due to animal (cat) (dog) hair and dander: Secondary | ICD-10-CM | POA: Diagnosis not present

## 2016-06-04 DIAGNOSIS — J3089 Other allergic rhinitis: Secondary | ICD-10-CM | POA: Diagnosis not present

## 2016-07-26 ENCOUNTER — Ambulatory Visit (INDEPENDENT_AMBULATORY_CARE_PROVIDER_SITE_OTHER): Payer: Medicare Other

## 2016-07-26 ENCOUNTER — Encounter: Payer: Self-pay | Admitting: Orthopedic Surgery

## 2016-07-26 ENCOUNTER — Ambulatory Visit (INDEPENDENT_AMBULATORY_CARE_PROVIDER_SITE_OTHER): Payer: Medicare Other | Admitting: Orthopedic Surgery

## 2016-07-26 VITALS — BP 150/77 | HR 75 | Ht 62.0 in | Wt 123.0 lb

## 2016-07-26 DIAGNOSIS — M545 Low back pain, unspecified: Secondary | ICD-10-CM

## 2016-07-26 NOTE — Progress Notes (Signed)
  NEW PROBLEM, OFFICE VISIT    Chief Complaint  Patient presents with  . Back Injury    right sided low back pain, DOI 07/13/16    83 year old female status post RIGHT hip replacement presents with acute lower back pain ON HER RIGHT SIDE for the last 13 days. This began after she had a minor trauma in her yard landing onto her  RIGHT SIDE. Her pain is primarily in the lower back and somewhat into the right buttock. The pain is getting better with just rest and Advil. It is a dull aching pain.      Review of Systems  Constitutional: Negative for chills and fever.  Musculoskeletal: Positive for back pain. Negative for falls.  Neurological: Negative for tingling and focal weakness.     Past Medical History:  Diagnosis Date  . Anxiety   . Arthritis   . Asthma   . Basal cell cancer   . Seasonal allergies     Past Surgical History:  Procedure Laterality Date  . AMPUTATION Left 04/01/2015   Procedure: PARTIAL SECOND RAY AMPUATATION OF LEFT FOOT (AMPUTATION OF TOE AND PORTION OF SECOND METATARSAL);  Surgeon: Caprice Beaver, DPM;  Location: [facility];  Service: Podiatry;  Laterality: Left;  . APPENDECTOMY    . bladder tack  1968  . BREAST LUMPECTOMY Left   . COLONOSCOPY  05/23/01    QJJ:HERD papilla and internal hemorrhoids, otherwise normal rectum/  Left-sided diverticula.  The remainder of the colonic mucosa appeared normal  . COLONOSCOPY  02/03/2012   Procedure: COLONOSCOPY;  Surgeon: Daneil Dolin, MD;  Location: [facility] SUITE;  Service: Endoscopy;  Laterality: N/A;  8:45  . HIP SURGERY Right    total hip  . JOINT REPLACEMENT Right    total hip  . LUMBAR DISC SURGERY    . TONSILLECTOMY      Family History  Problem Relation Age of Onset  . Colon cancer Paternal Uncle 102   Social History  Substance Use Topics  . Smoking status: Former Smoker    Packs/day: 0.50    Years: 10.00    Types: Cigarettes    Quit date: 02/22/1961  . Smokeless tobacco: Never Used     Comment:  about 50 years ago  . Alcohol use Yes     Comment: wine 3-4 nights/wk    BP (!) 150/77   Pulse 75   Ht 5\' 2"  (1.575 m)   Wt 123 lb (55.8 kg)   BMI 22.50 kg/m   Physical Exam  Constitutional: She is oriented to person, place, and time. She appears well-developed and well-nourished.  Neurological: She is alert and oriented to person, place, and time.  Psychiatric: She has a normal mood and affect.  Vitals reviewed.   Ortho Exam Gait remains normal  She does have some tenderness at L5-S1. On inspection of the right and left hip we find no swelling tenderness and the leg lengths are equal. She has normal range of motion in both hips both hips are stable she has good strength in both legs and normal neurovascular exam in the right and left leg  No skin lesions are noted on the right or left hip   Encounter Diagnosis  Name Primary?  . Acute right-sided low back pain without sciatica Yes     PLAN:   X-ray shows degenerative lumbar spondylosis with spondylolisthesis L4 on 5 and a normal right total hip

## 2016-11-05 DIAGNOSIS — J3089 Other allergic rhinitis: Secondary | ICD-10-CM | POA: Diagnosis not present

## 2016-11-05 DIAGNOSIS — J301 Allergic rhinitis due to pollen: Secondary | ICD-10-CM | POA: Diagnosis not present

## 2016-11-05 DIAGNOSIS — J3081 Allergic rhinitis due to animal (cat) (dog) hair and dander: Secondary | ICD-10-CM | POA: Diagnosis not present

## 2016-11-05 DIAGNOSIS — J4530 Mild persistent asthma, uncomplicated: Secondary | ICD-10-CM | POA: Diagnosis not present

## 2016-11-23 DIAGNOSIS — H524 Presbyopia: Secondary | ICD-10-CM | POA: Diagnosis not present

## 2016-11-23 DIAGNOSIS — H52223 Regular astigmatism, bilateral: Secondary | ICD-10-CM | POA: Diagnosis not present

## 2016-11-23 DIAGNOSIS — H5203 Hypermetropia, bilateral: Secondary | ICD-10-CM | POA: Diagnosis not present

## 2016-11-23 DIAGNOSIS — H25813 Combined forms of age-related cataract, bilateral: Secondary | ICD-10-CM | POA: Diagnosis not present

## 2016-12-09 DIAGNOSIS — Z23 Encounter for immunization: Secondary | ICD-10-CM | POA: Diagnosis not present

## 2017-02-07 DIAGNOSIS — J3081 Allergic rhinitis due to animal (cat) (dog) hair and dander: Secondary | ICD-10-CM | POA: Diagnosis not present

## 2017-02-07 DIAGNOSIS — J3089 Other allergic rhinitis: Secondary | ICD-10-CM | POA: Diagnosis not present

## 2017-02-07 DIAGNOSIS — J301 Allergic rhinitis due to pollen: Secondary | ICD-10-CM | POA: Diagnosis not present

## 2017-02-18 IMAGING — CR DG FOOT COMPLETE 3+V*L*
1 series · 3 of 3 positions shown · non-contrast
Comparison: None

CLINICAL DATA: Left foot toe amputation.  Postoperative evaluation.

EXAM:
LEFT FOOT - COMPLETE 3+ VIEW

[Series 2: ap · 0.17mm/px · 3 of 3 slices shown]
[im 1/3]
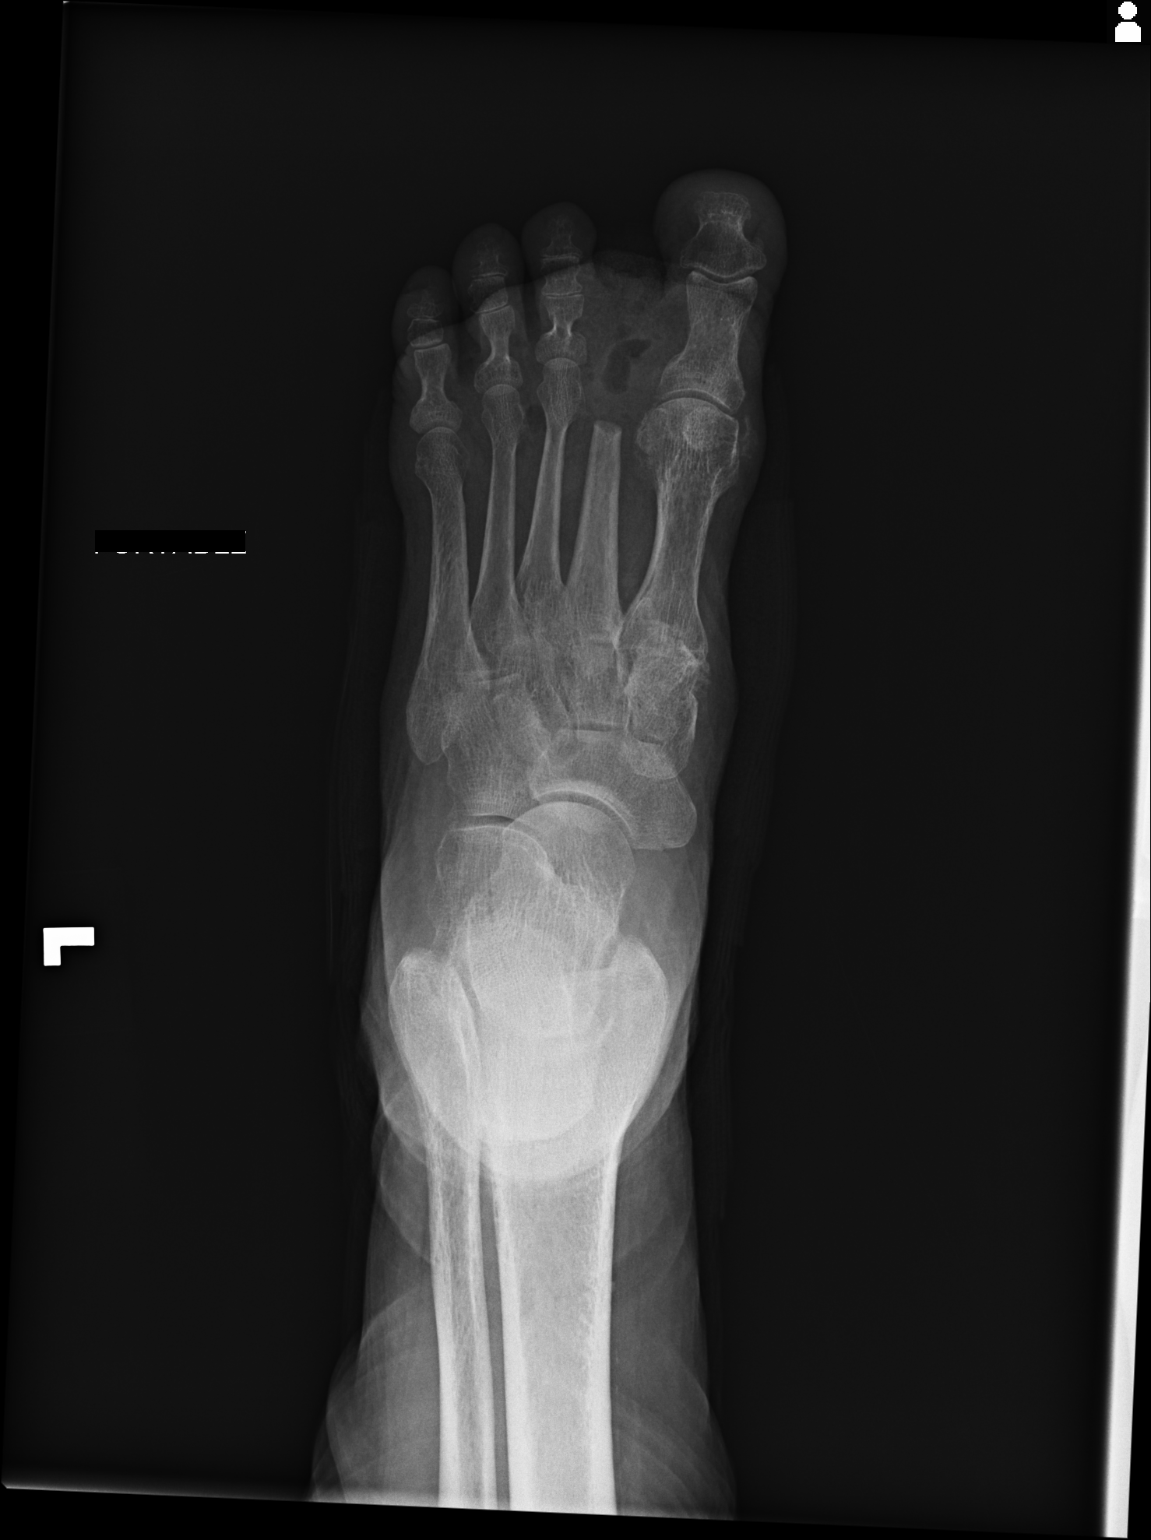
[im 2/3]
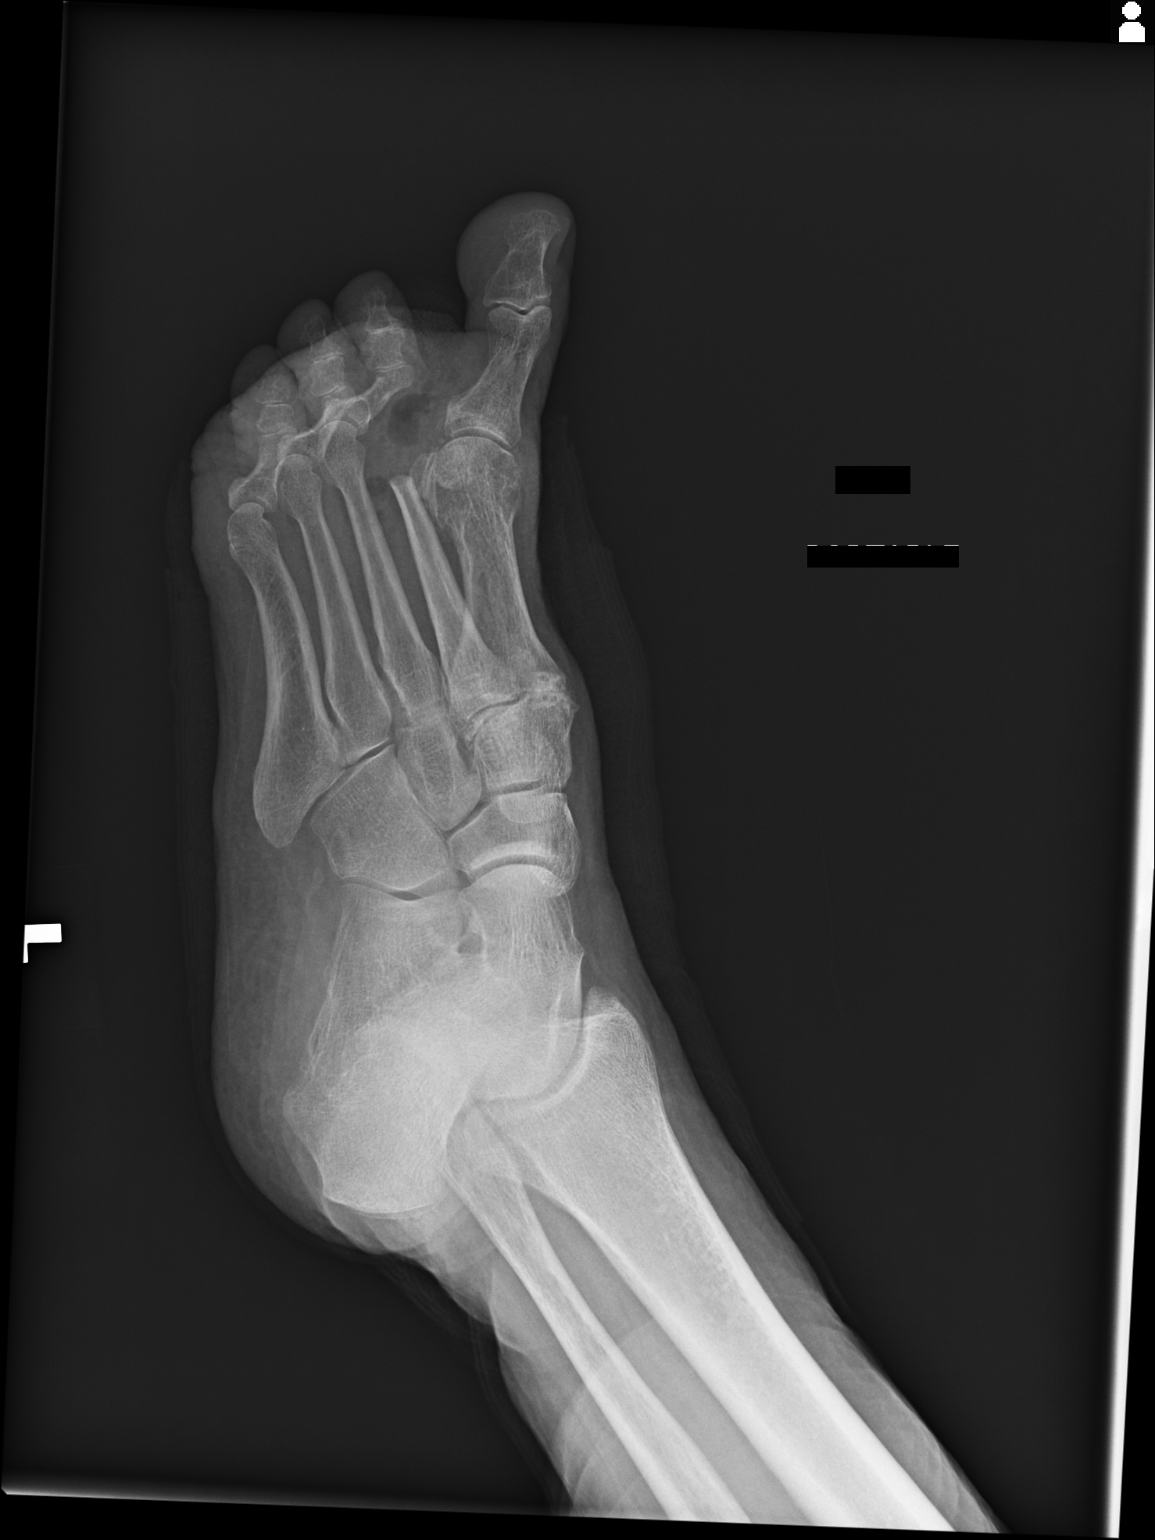
[im 3/3]
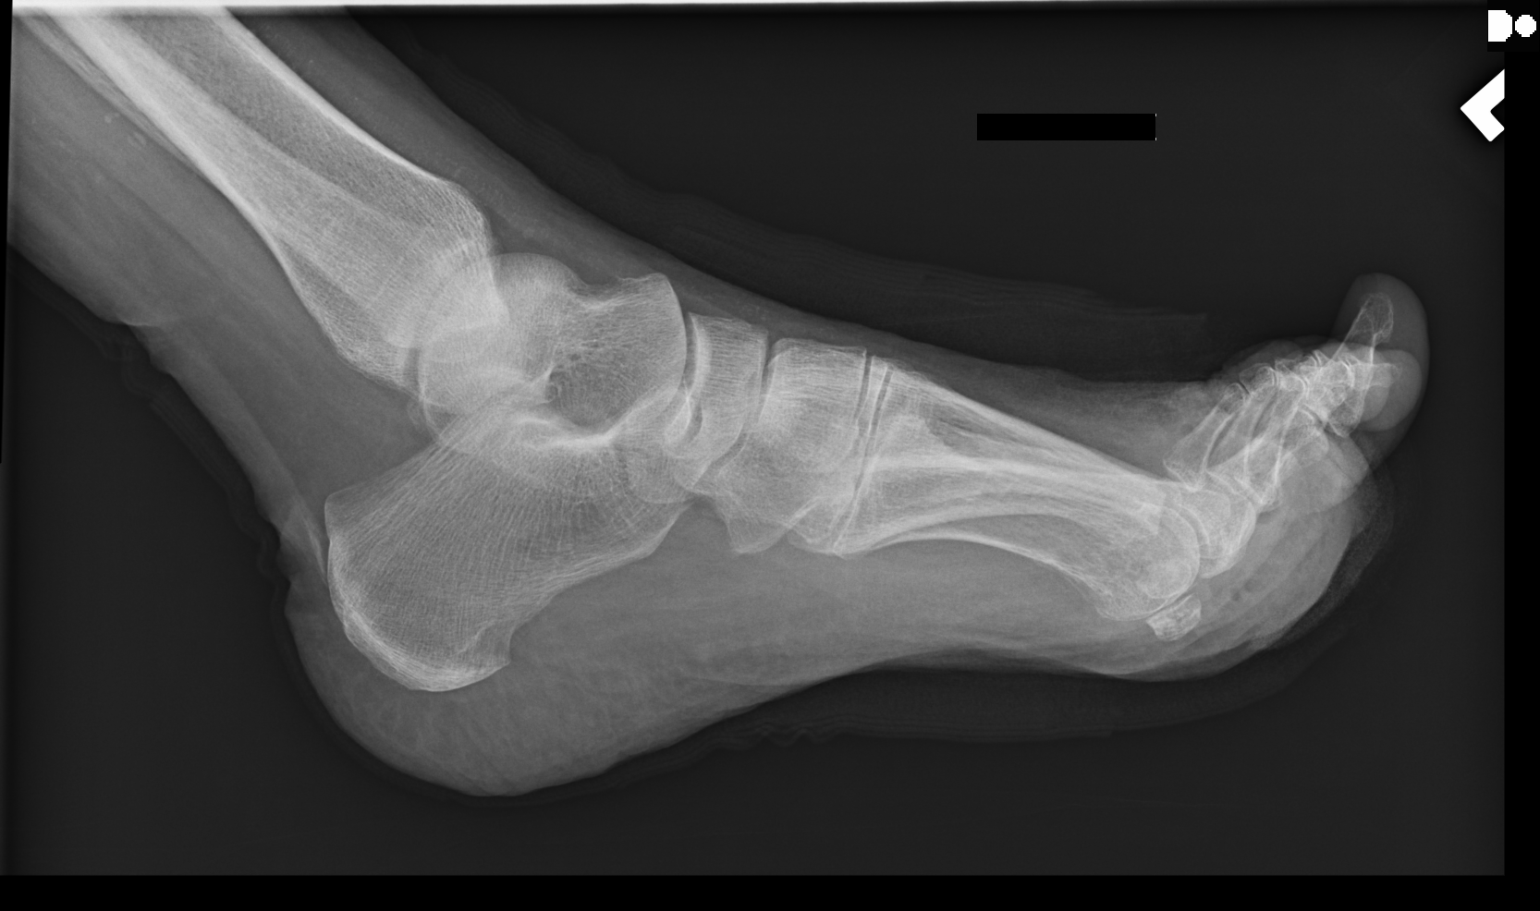

[3 of 3 positions shown; findings below may reference images not displayed]

FINDINGS: There has been resection of the left second toe as well as the
distal second metatarsal. There are no areas of bone resorption to
suggest osteomyelitis. There is joint space narrowing with
subchondral cystic change and sclerosis with small marginal spurs at
the first tarsal metatarsal articulation. There is an area of soft
tissue ossification along the medial margin of the first metatarsal
head.

Soft tissue air is seen between the bases of the proximal phalanx of
the great toe and third toe, within the amputation site.
IMPRESSION: S/p amputation of the left second toe occluding the distal second
metatarsal. There is no evidence of residual osteomyelitis. Soft
tissue air seen in the forefoot soft tissues is presumed to be post
operative.

## 2017-02-23 DIAGNOSIS — E785 Hyperlipidemia, unspecified: Secondary | ICD-10-CM | POA: Diagnosis not present

## 2017-02-23 DIAGNOSIS — M810 Age-related osteoporosis without current pathological fracture: Secondary | ICD-10-CM | POA: Diagnosis not present

## 2017-02-23 DIAGNOSIS — Z79899 Other long term (current) drug therapy: Secondary | ICD-10-CM | POA: Diagnosis not present

## 2017-02-23 DIAGNOSIS — J45902 Unspecified asthma with status asthmaticus: Secondary | ICD-10-CM | POA: Diagnosis not present

## 2017-02-23 DIAGNOSIS — M1990 Unspecified osteoarthritis, unspecified site: Secondary | ICD-10-CM | POA: Diagnosis not present

## 2017-03-03 DIAGNOSIS — Z6822 Body mass index (BMI) 22.0-22.9, adult: Secondary | ICD-10-CM | POA: Diagnosis not present

## 2017-03-03 DIAGNOSIS — E785 Hyperlipidemia, unspecified: Secondary | ICD-10-CM | POA: Diagnosis not present

## 2017-03-03 DIAGNOSIS — J4520 Mild intermittent asthma, uncomplicated: Secondary | ICD-10-CM | POA: Diagnosis not present

## 2017-03-03 DIAGNOSIS — Z0001 Encounter for general adult medical examination with abnormal findings: Secondary | ICD-10-CM | POA: Diagnosis not present

## 2017-03-28 DIAGNOSIS — H2513 Age-related nuclear cataract, bilateral: Secondary | ICD-10-CM | POA: Diagnosis not present

## 2017-03-28 DIAGNOSIS — H52223 Regular astigmatism, bilateral: Secondary | ICD-10-CM | POA: Diagnosis not present

## 2017-03-28 DIAGNOSIS — H1851 Endothelial corneal dystrophy: Secondary | ICD-10-CM | POA: Diagnosis not present

## 2017-03-28 DIAGNOSIS — H353131 Nonexudative age-related macular degeneration, bilateral, early dry stage: Secondary | ICD-10-CM | POA: Diagnosis not present

## 2017-04-07 DIAGNOSIS — H2512 Age-related nuclear cataract, left eye: Secondary | ICD-10-CM | POA: Diagnosis not present

## 2017-04-11 ENCOUNTER — Other Ambulatory Visit (HOSPITAL_COMMUNITY): Payer: Medicare Other

## 2017-04-11 DIAGNOSIS — D225 Melanocytic nevi of trunk: Secondary | ICD-10-CM | POA: Diagnosis not present

## 2017-04-11 DIAGNOSIS — Z85828 Personal history of other malignant neoplasm of skin: Secondary | ICD-10-CM | POA: Diagnosis not present

## 2017-04-11 DIAGNOSIS — L814 Other melanin hyperpigmentation: Secondary | ICD-10-CM | POA: Diagnosis not present

## 2017-04-11 DIAGNOSIS — L309 Dermatitis, unspecified: Secondary | ICD-10-CM | POA: Diagnosis not present

## 2017-04-11 NOTE — Patient Instructions (Addendum)
Your procedure is scheduled on: 04/18/2017   Report to [facility] at  26   AM.  Call this number if you have problems the morning of surgery: [phone]   Do not eat food or drink liquids :After Midnight.      Take these medicines the morning of surgery with A SIP OF WATER: celexa. Use your inhalers before you come.   Do not wear jewelry, make-up or nail polish.  Do not wear lotions, powders, or perfumes. You may wear deodorant.  Do not shave 48 hours prior to surgery.  Do not bring valuables to the hospital.  Contacts, dentures or bridgework may not be worn into surgery.  Leave suitcase in the car. After surgery it may be brought to your room.  For patients admitted to the hospital, checkout time is 11:00 AM the day of discharge.   Patients discharged the day of surgery will not be allowed to drive home.  :     Please read over the following fact sheets that you were given: Coughing and Deep Breathing, Surgical Site Infection Prevention, Anesthesia Post-op Instructions and Care and Recovery After Surgery    Cataract A cataract is a clouding of the lens of the eye. When a lens becomes cloudy, vision is reduced based on the degree and nature of the clouding. Many cataracts reduce vision to some degree. Some cataracts make people more near-sighted as they develop. Other cataracts increase glare. Cataracts that are ignored and become worse can sometimes look white. The white color can be seen through the pupil. CAUSES   Aging. However, cataracts may occur at any age, even in newborns.   Certain drugs.   Trauma to the eye.   Certain diseases such as diabetes.   Specific eye diseases such as chronic inflammation inside the eye or a sudden attack of a rare form of glaucoma.   Inherited or acquired medical problems.  SYMPTOMS   Gradual, progressive drop in vision in the affected eye.   Severe, rapid visual loss. This most often happens when trauma is the cause.  DIAGNOSIS  To  detect a cataract, an eye doctor examines the lens. Cataracts are best diagnosed with an exam of the eyes with the pupils enlarged (dilated) by drops.  TREATMENT  For an early cataract, vision may improve by using different eyeglasses or stronger lighting. If that does not help your vision, surgery is the only effective treatment. A cataract needs to be surgically removed when vision loss interferes with your everyday activities, such as driving, reading, or watching TV. A cataract may also have to be removed if it prevents examination or treatment of another eye problem. Surgery removes the cloudy lens and usually replaces it with a substitute lens (intraocular lens, IOL).  At a time when both you and your doctor agree, the cataract will be surgically removed. If you have cataracts in both eyes, only one is usually removed at a time. This allows the operated eye to heal and be out of danger from any possible problems after surgery (such as infection or poor wound healing). In rare cases, a cataract may be doing damage to your eye. In these cases, your caregiver may advise surgical removal right away. The vast majority of people who have cataract surgery have better vision afterward. HOME CARE INSTRUCTIONS  If you are not planning surgery, you may be asked to do the following:  Use different eyeglasses.   Use stronger or brighter lighting.   Ask your  eye doctor about reducing your medicine dose or changing medicines if it is thought that a medicine caused your cataract. Changing medicines does not make the cataract go away on its own.   Become familiar with your surroundings. Poor vision can lead to injury. Avoid bumping into things on the affected side. You are at a higher risk for tripping or falling.   Exercise extreme care when driving or operating machinery.   Wear sunglasses if you are sensitive to bright light or experiencing problems with glare.  SEEK IMMEDIATE MEDICAL CARE IF:   You have  a worsening or sudden vision loss.   You notice redness, swelling, or increasing pain in the eye.   You have a fever.  Document Released: 02/08/2005 Document Revised: 01/28/2011 Document Reviewed: 10/02/2010 [facility] Patient Information 2012 [place].PATIENT INSTRUCTIONS POST-ANESTHESIA  IMMEDIATELY FOLLOWING SURGERY:  Do not drive or operate machinery for the first twenty four hours after surgery.  Do not make any important decisions for twenty four hours after surgery or while taking narcotic pain medications or sedatives.  If you develop intractable nausea and vomiting or a severe headache please notify your doctor immediately.  FOLLOW-UP:  Please make an appointment with your surgeon as instructed. You do not need to follow up with anesthesia unless specifically instructed to do so.  WOUND CARE INSTRUCTIONS (if applicable):  Keep a dry clean dressing on the anesthesia/puncture wound site if there is drainage.  Once the wound has quit draining you may leave it open to air.  Generally you should leave the bandage intact for twenty four hours unless there is drainage.  If the epidural site drains for more than 36-48 hours please call the anesthesia department.  QUESTIONS?:  Please feel free to call your physician or the hospital operator if you have any questions, and they will be happy to assist you.

## 2017-04-12 ENCOUNTER — Encounter (HOSPITAL_COMMUNITY): Payer: Self-pay

## 2017-04-12 ENCOUNTER — Other Ambulatory Visit: Payer: Self-pay

## 2017-04-12 ENCOUNTER — Encounter (HOSPITAL_COMMUNITY)
Admission: RE | Admit: 2017-04-12 | Discharge: 2017-04-12 | Disposition: A | Discharge status: Home / Self Care | Payer: Medicare Other | Source: Ambulatory Visit | Attending: Ophthalmology | Admitting: Ophthalmology

## 2017-04-12 DIAGNOSIS — Z01812 Encounter for preprocedural laboratory examination: Secondary | ICD-10-CM | POA: Insufficient documentation

## 2017-04-12 DIAGNOSIS — Z01810 Encounter for preprocedural cardiovascular examination: Secondary | ICD-10-CM | POA: Diagnosis not present

## 2017-04-18 ENCOUNTER — Encounter (HOSPITAL_COMMUNITY): Payer: Self-pay | Admitting: *Deleted

## 2017-04-18 ENCOUNTER — Ambulatory Visit (HOSPITAL_COMMUNITY): Payer: Medicare Other | Admitting: Anesthesiology

## 2017-04-18 ENCOUNTER — Encounter (HOSPITAL_COMMUNITY)
Admission: RE | Disposition: A | Discharge status: Home / Self Care | Payer: Self-pay | Source: Ambulatory Visit | Attending: Ophthalmology

## 2017-04-18 ENCOUNTER — Ambulatory Visit (HOSPITAL_COMMUNITY)
Admission: RE | Admit: 2017-04-18 | Discharge: 2017-04-18 | Disposition: A | Discharge status: Home / Self Care | Payer: Medicare Other | Source: Ambulatory Visit | Attending: Ophthalmology | Admitting: Ophthalmology

## 2017-04-18 DIAGNOSIS — F329 Major depressive disorder, single episode, unspecified: Secondary | ICD-10-CM | POA: Diagnosis not present

## 2017-04-18 DIAGNOSIS — J449 Chronic obstructive pulmonary disease, unspecified: Secondary | ICD-10-CM | POA: Diagnosis not present

## 2017-04-18 DIAGNOSIS — Z87891 Personal history of nicotine dependence: Secondary | ICD-10-CM | POA: Insufficient documentation

## 2017-04-18 DIAGNOSIS — H2512 Age-related nuclear cataract, left eye: Secondary | ICD-10-CM | POA: Insufficient documentation

## 2017-04-18 DIAGNOSIS — Z79899 Other long term (current) drug therapy: Secondary | ICD-10-CM | POA: Diagnosis not present

## 2017-04-18 DIAGNOSIS — F419 Anxiety disorder, unspecified: Secondary | ICD-10-CM | POA: Diagnosis not present

## 2017-04-18 DIAGNOSIS — Z950 Presence of cardiac pacemaker: Secondary | ICD-10-CM | POA: Diagnosis not present

## 2017-04-18 HISTORY — PX: CATARACT EXTRACTION W/PHACO: SHX586

## 2017-04-18 SURGERY — PHACOEMULSIFICATION, CATARACT, WITH IOL INSERTION
Anesthesia: Monitor Anesthesia Care | Site: Eye | Laterality: Left

## 2017-04-18 MED ORDER — LIDOCAINE HCL (PF) 1 % IJ SOLN
INTRAMUSCULAR | Status: DC | PRN
  Administered 2017-04-18: .6 mL via OPHTHALMIC

## 2017-04-18 MED ORDER — LIDOCAINE HCL 3.5 % OP GEL
1.0000 "application " | Freq: Once | OPHTHALMIC | Status: AC
  Administered 2017-04-18: 1 via OPHTHALMIC

## 2017-04-18 MED ORDER — BSS IO SOLN
INTRAOCULAR | Status: DC | PRN
  Administered 2017-04-18: 15 mL

## 2017-04-18 MED ORDER — LACTATED RINGERS IV SOLN
INTRAVENOUS | Status: DC
  Administered 2017-04-18: 1000 mL via INTRAVENOUS

## 2017-04-18 MED ORDER — ONDANSETRON 4 MG PO TBDP
ORAL_TABLET | ORAL | Status: AC
  Filled 2017-04-18: qty 1

## 2017-04-18 MED ORDER — ONDANSETRON 4 MG PO TBDP
4.0000 mg | ORAL_TABLET | Freq: Once | ORAL | Status: AC
  Administered 2017-04-18: 4 mg via ORAL

## 2017-04-18 MED ORDER — BSS IO SOLN
INTRAOCULAR | Status: DC | PRN
  Administered 2017-04-18: 500 mL

## 2017-04-18 MED ORDER — PHENYLEPHRINE HCL 2.5 % OP SOLN
1.0000 [drp] | OPHTHALMIC | Status: AC
  Administered 2017-04-18 (×3): 1 [drp] via OPHTHALMIC

## 2017-04-18 MED ORDER — TETRACAINE HCL 0.5 % OP SOLN
1.0000 [drp] | OPHTHALMIC | Status: AC
  Administered 2017-04-18 (×3): 1 [drp] via OPHTHALMIC

## 2017-04-18 MED ORDER — POVIDONE-IODINE 5 % OP SOLN
OPHTHALMIC | Status: DC | PRN
  Administered 2017-04-18: 1 via OPHTHALMIC

## 2017-04-18 MED ORDER — MIDAZOLAM HCL 2 MG/2ML IJ SOLN
INTRAMUSCULAR | Status: AC
  Filled 2017-04-18: qty 2

## 2017-04-18 MED ORDER — NEOMYCIN-POLYMYXIN-DEXAMETH 3.5-10000-0.1 OP SUSP
OPHTHALMIC | Status: DC | PRN
  Administered 2017-04-18: 2 [drp] via OPHTHALMIC

## 2017-04-18 MED ORDER — CYCLOPENTOLATE-PHENYLEPHRINE 0.2-1 % OP SOLN
1.0000 [drp] | OPHTHALMIC | Status: AC
  Administered 2017-04-18 (×3): 1 [drp] via OPHTHALMIC

## 2017-04-18 MED ORDER — PROVISC 10 MG/ML IO SOLN
INTRAOCULAR | Status: DC | PRN
  Administered 2017-04-18: 0.85 mL via INTRAOCULAR

## 2017-04-18 MED ORDER — MIDAZOLAM HCL 2 MG/2ML IJ SOLN
1.0000 mg | Freq: Once | INTRAMUSCULAR | Status: AC | PRN
  Administered 2017-04-18: 2 mg via INTRAVENOUS

## 2017-04-18 SURGICAL SUPPLY — 12 items
CLOTH BEACON ORANGE TIMEOUT ST (SAFETY) ×2 IMPLANT
EYE SHIELD UNIVERSAL CLEAR (GAUZE/BANDAGES/DRESSINGS) ×3 IMPLANT
GLOVE BIOGEL PI IND STRL 6.5 (GLOVE) IMPLANT
GLOVE BIOGEL PI IND STRL 7.0 (GLOVE) ×1 IMPLANT
GLOVE BIOGEL PI INDICATOR 6.5 (GLOVE) ×2
GLOVE BIOGEL PI INDICATOR 7.0 (GLOVE) ×2
LENS ALC ACRYL/TECN (Ophthalmic Related) ×2 IMPLANT
PAD ARMBOARD 7.5X6 YLW CONV (MISCELLANEOUS) ×3 IMPLANT
SYRINGE LUER LOK 1CC (MISCELLANEOUS) ×3 IMPLANT
TAPE SURG TRANSPORE 1 IN (GAUZE/BANDAGES/DRESSINGS) ×1 IMPLANT
TAPE SURGICAL TRANSPORE 1 IN (GAUZE/BANDAGES/DRESSINGS) ×2
WATER STERILE IRR 250ML POUR (IV SOLUTION) ×2 IMPLANT

## 2017-04-18 NOTE — Transfer of Care (Signed)
Immediate Anesthesia Transfer of Care Note  Patient: Rachel [name]  Procedure(s) Performed: CATARACT EXTRACTION PHACO AND INTRAOCULAR LENS PLACEMENT (IOC) (Left Eye)  Patient Location: Short Stay  Anesthesia Type:MAC  Level of Consciousness: awake, alert , oriented and patient cooperative  Airway & Oxygen Therapy: Patient Spontanous Breathing  Post-op Assessment: Report given to RN and Post -op Vital signs reviewed and stable  Post vital signs: Reviewed and stable  Last Vitals:  Vitals:   04/18/17 0830 04/18/17 0835  BP: (!) 147/80 (!) 147/80  Pulse:    Resp: 18 (!) 24  Temp:    SpO2: 100% 100%    Last Pain:  Vitals:   04/18/17 0816  TempSrc: Oral      Patients Stated Pain Goal: 7 (16/10/96 0454)  Complications: No apparent anesthesia complications

## 2017-04-18 NOTE — Discharge Instructions (Signed)

## 2017-04-18 NOTE — Anesthesia Postprocedure Evaluation (Signed)
Anesthesia Post Note  Patient: Rachel [name]  Procedure(s) Performed: CATARACT EXTRACTION PHACO AND INTRAOCULAR LENS PLACEMENT (IOC) (Left Eye)  Patient location during evaluation: Short Stay Anesthesia Type: MAC Level of consciousness: patient cooperative and awake and alert Pain management: pain level controlled Vital Signs Assessment: post-procedure vital signs reviewed and stable Respiratory status: spontaneous breathing Cardiovascular status: stable Postop Assessment: no apparent nausea or vomiting Anesthetic complications: no     Last Vitals:  Vitals:   04/18/17 0830 04/18/17 0835  BP: (!) 147/80 (!) 147/80  Pulse:    Resp: 18 (!) 24  Temp:    SpO2: 100% 100%    Last Pain:  Vitals:   04/18/17 0816  TempSrc: Oral                 ADAMS, AMY A

## 2017-04-18 NOTE — Anesthesia Preprocedure Evaluation (Addendum)
Anesthesia Evaluation  Patient identified by MRN, date of birth, ID band Patient awake    Airway Mallampati: I       Dental no notable dental hx. (+) Teeth Intact   Pulmonary asthma , COPD (controlled),  COPD inhaler, former smoker,    Pulmonary exam normal        Cardiovascular Exercise Tolerance: Poor METS: 3 - Mets 5 - 7 Mets + pacemaker  Rhythm:Regular Rate:Normal     Neuro/Psych Anxiety    GI/Hepatic negative GI ROS, Neg liver ROS,   Endo/Other  negative endocrine ROS  Renal/GU negative Renal ROS     Musculoskeletal  (+) Arthritis , Osteoarthritis,    Abdominal Normal abdominal exam  (+)   Peds  Hematology   Anesthesia Other Findings   Reproductive/Obstetrics                            Anesthesia Physical Anesthesia Plan  ASA: III  Anesthesia Plan: MAC   Post-op Pain Management:    Induction:   PONV Risk Score and Plan:   Airway Management Planned: Nasal Cannula  Additional Equipment:   Intra-op Plan:   Post-operative Plan:   Informed Consent: I have reviewed the patients History and Physical, chart, labs and discussed the procedure including the risks, benefits and alternatives for the proposed anesthesia with the patient or authorized representative who has indicated his/her understanding and acceptance.   Dental advisory given  Plan Discussed with: CRNA  Anesthesia Plan Comments:         Anesthesia Quick Evaluation

## 2017-04-18 NOTE — Anesthesia Procedure Notes (Signed)
Procedure Name: MAC Date/Time: 04/18/2017 8:38 AM Performed by: Andree Elk Amy A, CRNA Pre-anesthesia Checklist: Patient identified, Timeout performed, Emergency Drugs available, Suction available and Patient being monitored Oxygen Delivery Method: Nasal cannula

## 2017-04-18 NOTE — Op Note (Signed)
Date of Admission: 04/18/2017  Date of Surgery: 04/18/2017  Pre-Op Dx: Cataract Left  Eye  Post-Op Dx: Senile Nuclear Cataract  Left  Eye,  Dx Code H25.12  Surgeon: Tonny Branch, M.D.  Assistants: None  Anesthesia: Topical with MAC  Indications: Painless, progressive loss of vision with compromise of daily activities.  Surgery: Cataract Extraction with Intraocular lens Implant Left Eye  Discription: The patient had dilating drops and viscous lidocaine placed into the Left eye in the pre-op holding area. After transfer to the operating room, a time out was performed. The patient was then prepped and draped. Beginning with a 43m blade a paracentesis port was made at the surgeon's 2 o'clock position. The anterior chamber was then filled with 1% non-preserved lidocaine. This was followed by filling the anterior chamber with Provisc.  A 2.441mkeratome blade was used to make a clear corneal incision at the temporal limbus.  A bent cystatome needle was used to create a continuous tear capsulotomy. Hydrodissection was performed with balanced salt solution on a Fine canula. The lens nucleus was then removed using the phacoemulsification handpiece. Residual cortex was removed with the I&A handpiece. The anterior chamber and capsular bag were refilled with Provisc. A posterior chamber intraocular lens was placed into the capsular bag with it's injector. The implant was positioned with the Kuglan hook. The Provisc was then removed from the anterior chamber and capsular bag with the I&A handpiece. Stromal hydration of the main incision and paracentesis port was performed with BSS on a Fine canula. The wounds were tested for leak which was negative. The patient tolerated the procedure well. There were no operative complications. The patient was then transferred to the recovery room in stable condition.  Complications: None  Specimen: None  EBL: None  Prosthetic device: J&J Technis, PCB00, power 23.5, SN  [id number]

## 2017-04-19 ENCOUNTER — Encounter (HOSPITAL_COMMUNITY): Payer: Self-pay | Admitting: Ophthalmology

## 2017-04-25 DIAGNOSIS — H2511 Age-related nuclear cataract, right eye: Secondary | ICD-10-CM | POA: Diagnosis not present

## 2017-04-28 ENCOUNTER — Encounter (HOSPITAL_COMMUNITY): Payer: Self-pay

## 2017-04-28 ENCOUNTER — Encounter (HOSPITAL_COMMUNITY)
Admission: RE | Admit: 2017-04-28 | Discharge: 2017-04-28 | Disposition: A | Discharge status: Home / Self Care | Payer: Medicare Other | Source: Ambulatory Visit | Attending: Ophthalmology | Admitting: Ophthalmology

## 2017-04-28 NOTE — Pre-Procedure Instructions (Signed)
Called to do preop call with patient. She had labs drawn at Dr Ria Comment office and I called there and spoke with Abigail Butts who will fax labs over.

## 2017-05-02 ENCOUNTER — Encounter (HOSPITAL_COMMUNITY)
Admission: RE | Disposition: A | Discharge status: Home / Self Care | Payer: Self-pay | Source: Ambulatory Visit | Attending: Ophthalmology

## 2017-05-02 ENCOUNTER — Ambulatory Visit (HOSPITAL_COMMUNITY)
Admission: RE | Admit: 2017-05-02 | Discharge: 2017-05-02 | Disposition: A | Discharge status: Home / Self Care | Payer: Medicare Other | Source: Ambulatory Visit | Attending: Ophthalmology | Admitting: Ophthalmology

## 2017-05-02 ENCOUNTER — Encounter (HOSPITAL_COMMUNITY): Payer: Self-pay | Admitting: *Deleted

## 2017-05-02 ENCOUNTER — Ambulatory Visit (HOSPITAL_COMMUNITY): Payer: Medicare Other | Admitting: Anesthesiology

## 2017-05-02 DIAGNOSIS — J449 Chronic obstructive pulmonary disease, unspecified: Secondary | ICD-10-CM | POA: Diagnosis not present

## 2017-05-02 DIAGNOSIS — Z87891 Personal history of nicotine dependence: Secondary | ICD-10-CM | POA: Insufficient documentation

## 2017-05-02 DIAGNOSIS — Z79899 Other long term (current) drug therapy: Secondary | ICD-10-CM | POA: Diagnosis not present

## 2017-05-02 DIAGNOSIS — H2511 Age-related nuclear cataract, right eye: Secondary | ICD-10-CM | POA: Diagnosis not present

## 2017-05-02 HISTORY — PX: CATARACT EXTRACTION W/PHACO: SHX586

## 2017-05-02 SURGERY — PHACOEMULSIFICATION, CATARACT, WITH IOL INSERTION
Anesthesia: Monitor Anesthesia Care | Site: Eye | Laterality: Right

## 2017-05-02 MED ORDER — CYCLOPENTOLATE-PHENYLEPHRINE 0.2-1 % OP SOLN
1.0000 [drp] | OPHTHALMIC | Status: AC
  Administered 2017-05-02 (×3): 1 [drp] via OPHTHALMIC

## 2017-05-02 MED ORDER — MIDAZOLAM HCL 2 MG/2ML IJ SOLN
1.0000 mg | INTRAMUSCULAR | Status: AC
  Administered 2017-05-02: 2 mg via INTRAVENOUS
  Filled 2017-05-02: qty 2

## 2017-05-02 MED ORDER — PROVISC 10 MG/ML IO SOLN
INTRAOCULAR | Status: DC | PRN
  Administered 2017-05-02: 0.85 mL via INTRAOCULAR

## 2017-05-02 MED ORDER — BSS IO SOLN
INTRAOCULAR | Status: DC | PRN
  Administered 2017-05-02: 15 mL

## 2017-05-02 MED ORDER — EPINEPHRINE PF 1 MG/ML IJ SOLN
INTRAOCULAR | Status: DC | PRN
  Administered 2017-05-02: 500 mL

## 2017-05-02 MED ORDER — POVIDONE-IODINE 5 % OP SOLN
OPHTHALMIC | Status: DC | PRN
  Administered 2017-05-02: 1 via OPHTHALMIC

## 2017-05-02 MED ORDER — TETRACAINE HCL 0.5 % OP SOLN
1.0000 [drp] | OPHTHALMIC | Status: AC
  Administered 2017-05-02 (×3): 1 [drp] via OPHTHALMIC

## 2017-05-02 MED ORDER — FENTANYL CITRATE (PF) 100 MCG/2ML IJ SOLN
25.0000 ug | Freq: Once | INTRAMUSCULAR | Status: AC
  Administered 2017-05-02: 25 ug via INTRAVENOUS
  Filled 2017-05-02: qty 2

## 2017-05-02 MED ORDER — LACTATED RINGERS IV SOLN
INTRAVENOUS | Status: DC
  Administered 2017-05-02: 08:00:00 via INTRAVENOUS

## 2017-05-02 MED ORDER — LIDOCAINE HCL (PF) 1 % IJ SOLN
INTRAOCULAR | Status: DC | PRN
  Administered 2017-05-02: .7 mL via OPHTHALMIC

## 2017-05-02 MED ORDER — PHENYLEPHRINE HCL 2.5 % OP SOLN
1.0000 [drp] | OPHTHALMIC | Status: AC
  Administered 2017-05-02 (×3): 1 [drp] via OPHTHALMIC

## 2017-05-02 MED ORDER — LIDOCAINE HCL 3.5 % OP GEL
1.0000 "application " | Freq: Once | OPHTHALMIC | Status: AC
  Administered 2017-05-02: 1 via OPHTHALMIC

## 2017-05-02 SURGICAL SUPPLY — 12 items
CLOTH BEACON ORANGE TIMEOUT ST (SAFETY) ×2 IMPLANT
EYE SHIELD UNIVERSAL CLEAR (GAUZE/BANDAGES/DRESSINGS) ×3 IMPLANT
GLOVE BIOGEL PI IND STRL 6.5 (GLOVE) IMPLANT
GLOVE BIOGEL PI IND STRL 7.0 (GLOVE) ×1 IMPLANT
GLOVE BIOGEL PI INDICATOR 6.5 (GLOVE) ×2
GLOVE BIOGEL PI INDICATOR 7.0 (GLOVE) ×2
LENS ALC ACRYL/TECN (Ophthalmic Related) ×3 IMPLANT
PAD ARMBOARD 7.5X6 YLW CONV (MISCELLANEOUS) ×3 IMPLANT
SYRINGE LUER LOK 1CC (MISCELLANEOUS) ×2 IMPLANT
TAPE SURG TRANSPORE 1 IN (GAUZE/BANDAGES/DRESSINGS) ×1 IMPLANT
TAPE SURGICAL TRANSPORE 1 IN (GAUZE/BANDAGES/DRESSINGS) ×2
WATER STERILE IRR 250ML POUR (IV SOLUTION) ×2 IMPLANT

## 2017-05-02 NOTE — Anesthesia Postprocedure Evaluation (Signed)
Anesthesia Post Note  Patient: Rachel [name]  Procedure(s) Performed: CATARACT EXTRACTION PHACO AND INTRAOCULAR LENS PLACEMENT (IOC) (Right Eye)  Patient location during evaluation: Short Stay Anesthesia Type: MAC Level of consciousness: awake and alert and oriented Pain management: pain level controlled Vital Signs Assessment: post-procedure vital signs reviewed and stable Cardiovascular status: stable Postop Assessment: no apparent nausea or vomiting Anesthetic complications: no     Last Vitals:  Vitals:   05/02/17 0855 05/02/17 0900  BP: (!) 103/50 (!) 95/50  Pulse:    Resp: 15 13  Temp:    SpO2: 94% 94%    Last Pain:  Vitals:   05/02/17 0801  TempSrc: Oral  PainSc: 0-No pain                 ADAMS, AMY A

## 2017-05-02 NOTE — Anesthesia Procedure Notes (Signed)
Procedure Name: MAC Date/Time: 05/02/2017 9:02 AM Performed by: Andree Elk, Isom Kochan A, CRNA Pre-anesthesia Checklist: Patient identified, Timeout performed, Suction available, Emergency Drugs available and Patient being monitored Oxygen Delivery Method: Nasal cannula

## 2017-05-02 NOTE — Anesthesia Preprocedure Evaluation (Signed)
Anesthesia Evaluation  Patient identified by MRN, date of birth, ID band Patient awake    Reviewed: Allergy & Precautions, NPO status , Patient's Chart, lab work & pertinent test results  Airway Mallampati: I       Dental no notable dental hx. (+) Teeth Intact   Pulmonary asthma , COPD (controlled),  COPD inhaler, former smoker,    Pulmonary exam normal        Cardiovascular Exercise Tolerance: Poor METS: 3 - Mets 5 - 7 Mets + pacemaker  Rhythm:Regular Rate:Normal     Neuro/Psych PSYCHIATRIC DISORDERS Anxiety    GI/Hepatic negative GI ROS, Neg liver ROS,   Endo/Other  negative endocrine ROS  Renal/GU negative Renal ROS     Musculoskeletal  (+) Arthritis , Osteoarthritis,    Abdominal Normal abdominal exam  (+)   Peds  Hematology   Anesthesia Other Findings   Reproductive/Obstetrics                             Anesthesia Physical Anesthesia Plan  ASA: III  Anesthesia Plan: MAC   Post-op Pain Management:    Induction:   PONV Risk Score and Plan:   Airway Management Planned: Nasal Cannula  Additional Equipment:   Intra-op Plan:   Post-operative Plan:   Informed Consent: I have reviewed the patients History and Physical, chart, labs and discussed the procedure including the risks, benefits and alternatives for the proposed anesthesia with the patient or authorized representative who has indicated his/her understanding and acceptance.   Dental advisory given  Plan Discussed with: CRNA  Anesthesia Plan Comments:         Anesthesia Quick Evaluation

## 2017-05-02 NOTE — H&P (Signed)
I have reviewed the H&P, the patient was re-examined, and I have identified no interval changes in medical condition and plan of care since the history and physical of record  

## 2017-05-02 NOTE — Op Note (Signed)
Date of Admission: 05/02/2017  Date of Surgery: 05/02/2017  Pre-Op Dx: Cataract Right  Eye  Post-Op Dx: Senile Nuclear Cataract  Right  Eye,  Dx Code H25.11  Surgeon: Tonny Branch, M.D.  Assistants: None  Anesthesia: Topical with MAC  Indications: Painless, progressive loss of vision with compromise of daily activities.  Surgery: Cataract Extraction with Intraocular lens Implant Right Eye  Discription: The patient had dilating drops and viscous lidocaine placed into the Right eye in the pre-op holding area. After transfer to the operating room, a time out was performed. The patient was then prepped and draped. Beginning with a 50m blade a paracentesis port was made at the surgeon's 2 o'clock position. The anterior chamber was then filled with 1% non-preserved lidocaine. This was followed by filling the anterior chamber with Provisc.  A 2.433mkeratome blade was used to make a clear corneal incision at the temporal limbus.  A bent cystatome needle was used to create a continuous tear capsulotomy. Hydrodissection was performed with balanced salt solution on a Fine canula. The lens nucleus was then removed using the phacoemulsification handpiece. Residual cortex was removed with the I&A handpiece. The anterior chamber and capsular bag were refilled with Provisc. A posterior chamber intraocular lens was placed into the capsular bag with it's injector. The implant was positioned with the Kuglan hook. The Provisc was then removed from the anterior chamber and capsular bag with the I&A handpiece. Stromal hydration of the main incision and paracentesis port was performed with BSS on a Fine canula. The wounds were tested for leak which was negative. The patient tolerated the procedure well. There were no operative complications. The patient was then transferred to the recovery room in stable condition.  Complications: None  Specimen: None  EBL: None  Prosthetic device: J&J Technis, PCB00, power 23.5, SN  [id number]

## 2017-05-02 NOTE — Transfer of Care (Signed)
Immediate Anesthesia Transfer of Care Note  Patient: Rachel [name]  Procedure(s) Performed: CATARACT EXTRACTION PHACO AND INTRAOCULAR LENS PLACEMENT (IOC) (Right Eye)  Patient Location: PACU  Anesthesia Type:MAC  Level of Consciousness: awake, alert , oriented and patient cooperative  Airway & Oxygen Therapy: Patient Spontanous Breathing  Post-op Assessment: Report given to RN and Post -op Vital signs reviewed and stable  Post vital signs: Reviewed and stable  Last Vitals:  Vitals:   05/02/17 0855 05/02/17 0900  BP: (!) 103/50 (!) 95/50  Pulse:    Resp: 15 13  Temp:    SpO2: 94% 94%    Last Pain:  Vitals:   05/02/17 0801  TempSrc: Oral  PainSc: 0-No pain         Complications: No apparent anesthesia complications

## 2017-05-03 ENCOUNTER — Encounter (HOSPITAL_COMMUNITY): Payer: Self-pay | Admitting: Ophthalmology

## 2017-05-04 ENCOUNTER — Encounter (HOSPITAL_COMMUNITY): Payer: Self-pay | Admitting: Ophthalmology

## 2017-05-04 MED ORDER — NEOMYCIN-POLYMYXIN-DEXAMETH 3.5-10000-0.1 OP SUSP
OPHTHALMIC | Status: DC | PRN
  Administered 2017-05-02: 2 [drp] via OPHTHALMIC

## 2017-06-07 DIAGNOSIS — Z961 Presence of intraocular lens: Secondary | ICD-10-CM | POA: Diagnosis not present

## 2017-11-07 DIAGNOSIS — J4530 Mild persistent asthma, uncomplicated: Secondary | ICD-10-CM | POA: Diagnosis not present

## 2017-11-07 DIAGNOSIS — J301 Allergic rhinitis due to pollen: Secondary | ICD-10-CM | POA: Diagnosis not present

## 2017-11-07 DIAGNOSIS — J3089 Other allergic rhinitis: Secondary | ICD-10-CM | POA: Diagnosis not present

## 2017-11-07 DIAGNOSIS — J3081 Allergic rhinitis due to animal (cat) (dog) hair and dander: Secondary | ICD-10-CM | POA: Diagnosis not present

## 2017-12-01 DIAGNOSIS — Z23 Encounter for immunization: Secondary | ICD-10-CM | POA: Diagnosis not present

## 2018-03-14 DIAGNOSIS — M1990 Unspecified osteoarthritis, unspecified site: Secondary | ICD-10-CM | POA: Diagnosis not present

## 2018-03-14 DIAGNOSIS — Z79899 Other long term (current) drug therapy: Secondary | ICD-10-CM | POA: Diagnosis not present

## 2018-03-14 DIAGNOSIS — J4540 Moderate persistent asthma, uncomplicated: Secondary | ICD-10-CM | POA: Diagnosis not present

## 2018-03-14 DIAGNOSIS — E785 Hyperlipidemia, unspecified: Secondary | ICD-10-CM | POA: Diagnosis not present

## 2018-03-14 DIAGNOSIS — M810 Age-related osteoporosis without current pathological fracture: Secondary | ICD-10-CM | POA: Diagnosis not present

## 2018-03-21 DIAGNOSIS — J45909 Unspecified asthma, uncomplicated: Secondary | ICD-10-CM | POA: Diagnosis not present

## 2018-03-21 DIAGNOSIS — Z0001 Encounter for general adult medical examination with abnormal findings: Secondary | ICD-10-CM | POA: Diagnosis not present

## 2018-03-21 DIAGNOSIS — I493 Ventricular premature depolarization: Secondary | ICD-10-CM | POA: Diagnosis not present

## 2018-03-21 DIAGNOSIS — Z6823 Body mass index (BMI) 23.0-23.9, adult: Secondary | ICD-10-CM | POA: Diagnosis not present

## 2018-06-30 DIAGNOSIS — R454 Irritability and anger: Secondary | ICD-10-CM | POA: Diagnosis not present

## 2018-06-30 DIAGNOSIS — R413 Other amnesia: Secondary | ICD-10-CM | POA: Diagnosis not present

## 2018-07-24 DIAGNOSIS — R413 Other amnesia: Secondary | ICD-10-CM | POA: Diagnosis not present

## 2018-07-24 DIAGNOSIS — R454 Irritability and anger: Secondary | ICD-10-CM | POA: Diagnosis not present

## 2018-08-15 ENCOUNTER — Other Ambulatory Visit (HOSPITAL_COMMUNITY): Payer: Self-pay | Admitting: Internal Medicine

## 2018-08-15 DIAGNOSIS — R413 Other amnesia: Secondary | ICD-10-CM | POA: Diagnosis not present

## 2018-08-15 DIAGNOSIS — R454 Irritability and anger: Secondary | ICD-10-CM | POA: Diagnosis not present

## 2018-08-23 ENCOUNTER — Ambulatory Visit (HOSPITAL_COMMUNITY)
Admission: RE | Admit: 2018-08-23 | Discharge: 2018-08-23 | Disposition: A | Discharge status: Home / Self Care | Payer: Medicare Other | Source: Ambulatory Visit | Attending: Internal Medicine | Admitting: Internal Medicine

## 2018-08-23 ENCOUNTER — Other Ambulatory Visit: Payer: Self-pay

## 2018-08-23 DIAGNOSIS — R413 Other amnesia: Secondary | ICD-10-CM

## 2018-09-15 DIAGNOSIS — R454 Irritability and anger: Secondary | ICD-10-CM | POA: Diagnosis not present

## 2018-10-10 ENCOUNTER — Other Ambulatory Visit: Payer: Self-pay

## 2018-10-10 DIAGNOSIS — Z20822 Contact with and (suspected) exposure to covid-19: Secondary | ICD-10-CM

## 2018-10-11 LAB — NOVEL CORONAVIRUS, NAA: SARS-CoV-2, NAA: NOT DETECTED

## 2018-10-24 ENCOUNTER — Encounter: Payer: Self-pay | Admitting: Orthopedic Surgery

## 2018-10-24 ENCOUNTER — Telehealth: Payer: Self-pay | Admitting: Radiology

## 2018-10-24 NOTE — Telephone Encounter (Signed)
Sheri at patient's dentist office called, patient is there now for dental procedure.  Asking if patient needs premed.  I advised per protocol, yes premed needed. They have amoxicillin on hand and I advised amox 2 gm 1 hr prior to procedure.  I will also fax a note stating this, [phone].

## 2018-11-10 DIAGNOSIS — Z23 Encounter for immunization: Secondary | ICD-10-CM | POA: Diagnosis not present

## 2018-11-16 DIAGNOSIS — R413 Other amnesia: Secondary | ICD-10-CM | POA: Diagnosis not present

## 2018-11-16 DIAGNOSIS — R454 Irritability and anger: Secondary | ICD-10-CM | POA: Diagnosis not present

## 2019-02-27 DIAGNOSIS — J45901 Unspecified asthma with (acute) exacerbation: Secondary | ICD-10-CM | POA: Diagnosis not present

## 2019-02-27 DIAGNOSIS — R454 Irritability and anger: Secondary | ICD-10-CM | POA: Diagnosis not present

## 2019-04-05 DIAGNOSIS — M1990 Unspecified osteoarthritis, unspecified site: Secondary | ICD-10-CM | POA: Diagnosis not present

## 2019-04-05 DIAGNOSIS — J45909 Unspecified asthma, uncomplicated: Secondary | ICD-10-CM | POA: Diagnosis not present

## 2019-04-05 DIAGNOSIS — R454 Irritability and anger: Secondary | ICD-10-CM | POA: Diagnosis not present

## 2019-04-05 DIAGNOSIS — Z79899 Other long term (current) drug therapy: Secondary | ICD-10-CM | POA: Diagnosis not present

## 2019-04-09 DIAGNOSIS — J45909 Unspecified asthma, uncomplicated: Secondary | ICD-10-CM | POA: Diagnosis not present

## 2019-04-09 DIAGNOSIS — R454 Irritability and anger: Secondary | ICD-10-CM | POA: Diagnosis not present

## 2019-04-09 DIAGNOSIS — R413 Other amnesia: Secondary | ICD-10-CM | POA: Diagnosis not present

## 2019-04-09 DIAGNOSIS — M1990 Unspecified osteoarthritis, unspecified site: Secondary | ICD-10-CM | POA: Diagnosis not present

## 2019-07-09 DIAGNOSIS — R454 Irritability and anger: Secondary | ICD-10-CM | POA: Diagnosis not present

## 2019-07-09 DIAGNOSIS — Z6822 Body mass index (BMI) 22.0-22.9, adult: Secondary | ICD-10-CM | POA: Diagnosis not present

## 2019-07-09 DIAGNOSIS — J45909 Unspecified asthma, uncomplicated: Secondary | ICD-10-CM | POA: Diagnosis not present

## 2019-07-09 DIAGNOSIS — R413 Other amnesia: Secondary | ICD-10-CM | POA: Diagnosis not present

## 2019-10-09 DIAGNOSIS — R454 Irritability and anger: Secondary | ICD-10-CM | POA: Diagnosis not present

## 2019-10-09 DIAGNOSIS — R413 Other amnesia: Secondary | ICD-10-CM | POA: Diagnosis not present

## 2019-10-22 DIAGNOSIS — H353132 Nonexudative age-related macular degeneration, bilateral, intermediate dry stage: Secondary | ICD-10-CM | POA: Diagnosis not present

## 2019-10-22 DIAGNOSIS — H01001 Unspecified blepharitis right upper eyelid: Secondary | ICD-10-CM | POA: Diagnosis not present

## 2019-10-22 DIAGNOSIS — H01002 Unspecified blepharitis right lower eyelid: Secondary | ICD-10-CM | POA: Diagnosis not present

## 2019-10-22 DIAGNOSIS — H01004 Unspecified blepharitis left upper eyelid: Secondary | ICD-10-CM | POA: Diagnosis not present

## 2019-10-22 DIAGNOSIS — H26493 Other secondary cataract, bilateral: Secondary | ICD-10-CM | POA: Diagnosis not present

## 2019-11-01 DIAGNOSIS — H353132 Nonexudative age-related macular degeneration, bilateral, intermediate dry stage: Secondary | ICD-10-CM | POA: Diagnosis not present

## 2019-12-20 DIAGNOSIS — Z23 Encounter for immunization: Secondary | ICD-10-CM | POA: Diagnosis not present

## 2020-02-08 DIAGNOSIS — R634 Abnormal weight loss: Secondary | ICD-10-CM | POA: Diagnosis not present

## 2020-03-06 ENCOUNTER — Ambulatory Visit: Payer: Self-pay

## 2020-03-13 DIAGNOSIS — F329 Major depressive disorder, single episode, unspecified: Secondary | ICD-10-CM | POA: Diagnosis not present

## 2020-03-13 DIAGNOSIS — R634 Abnormal weight loss: Secondary | ICD-10-CM | POA: Diagnosis not present

## 2020-04-08 DIAGNOSIS — M810 Age-related osteoporosis without current pathological fracture: Secondary | ICD-10-CM | POA: Diagnosis not present

## 2020-04-08 DIAGNOSIS — M1990 Unspecified osteoarthritis, unspecified site: Secondary | ICD-10-CM | POA: Diagnosis not present

## 2020-04-08 DIAGNOSIS — Z79899 Other long term (current) drug therapy: Secondary | ICD-10-CM | POA: Diagnosis not present

## 2020-04-08 DIAGNOSIS — R413 Other amnesia: Secondary | ICD-10-CM | POA: Diagnosis not present

## 2020-04-08 DIAGNOSIS — J45901 Unspecified asthma with (acute) exacerbation: Secondary | ICD-10-CM | POA: Diagnosis not present

## 2020-04-14 DIAGNOSIS — Z0000 Encounter for general adult medical examination without abnormal findings: Secondary | ICD-10-CM | POA: Diagnosis not present

## 2020-04-14 DIAGNOSIS — J4520 Mild intermittent asthma, uncomplicated: Secondary | ICD-10-CM | POA: Diagnosis not present

## 2020-04-14 DIAGNOSIS — R413 Other amnesia: Secondary | ICD-10-CM | POA: Diagnosis not present

## 2020-04-14 DIAGNOSIS — Z6820 Body mass index (BMI) 20.0-20.9, adult: Secondary | ICD-10-CM | POA: Diagnosis not present

## 2020-05-01 DIAGNOSIS — H01001 Unspecified blepharitis right upper eyelid: Secondary | ICD-10-CM | POA: Diagnosis not present

## 2020-05-01 DIAGNOSIS — H01002 Unspecified blepharitis right lower eyelid: Secondary | ICD-10-CM | POA: Diagnosis not present

## 2020-05-01 DIAGNOSIS — H353132 Nonexudative age-related macular degeneration, bilateral, intermediate dry stage: Secondary | ICD-10-CM | POA: Diagnosis not present

## 2020-05-01 DIAGNOSIS — H01004 Unspecified blepharitis left upper eyelid: Secondary | ICD-10-CM | POA: Diagnosis not present

## 2020-05-01 DIAGNOSIS — H01005 Unspecified blepharitis left lower eyelid: Secondary | ICD-10-CM | POA: Diagnosis not present

## 2020-05-01 DIAGNOSIS — Z961 Presence of intraocular lens: Secondary | ICD-10-CM | POA: Diagnosis not present

## 2020-07-12 IMAGING — MR MRI HEAD WITHOUT CONTRAST
6 of 10 series · 25 of 48 positions shown · non-contrast
Comparison: None.

CLINICAL DATA: Memory disturbance

EXAM:
MRI HEAD WITHOUT CONTRAST
TECHNIQUE: Multiplanar, multiecho pulse sequences of the brain and surrounding
structures were obtained without intravenous contrast.

[Series 2: DWI · axial · 3.0mm · 0.73mm/px · z∈[-86,+67]mm · 5 of 52 slices shown (1 of 2)]
[im 1/52]
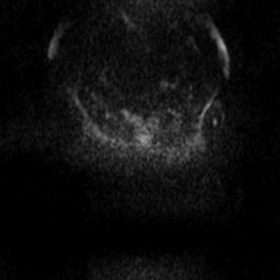
[im 13/52]
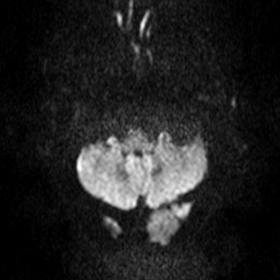
[im 26/52]
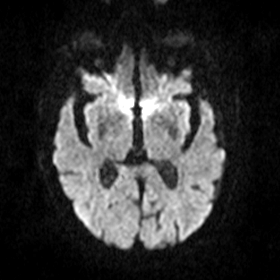
[im 39/52]
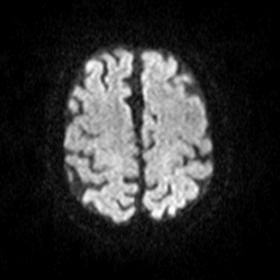
[im 52/52]
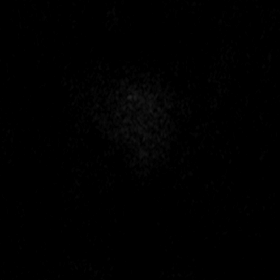

[Series 4: DWI · coronal · 5.0mm · 0.48mm/px · 5 of 38 slices shown (2 of 2)]
[im 1/38]
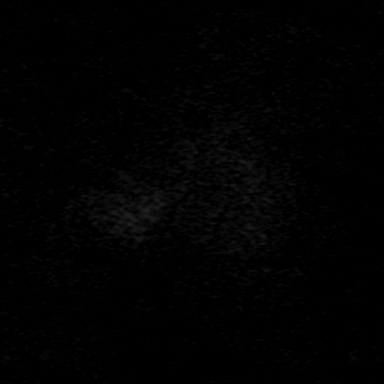
[im 10/38]
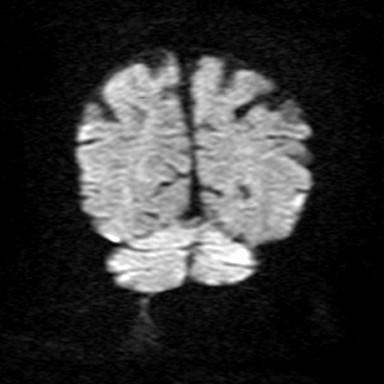
[im 19/38]
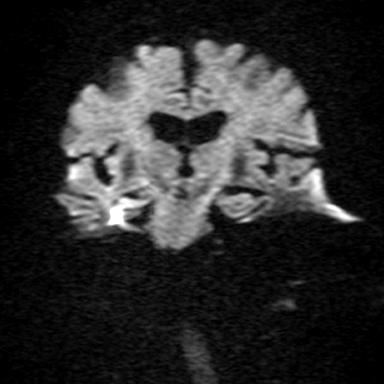
[im 28/38]
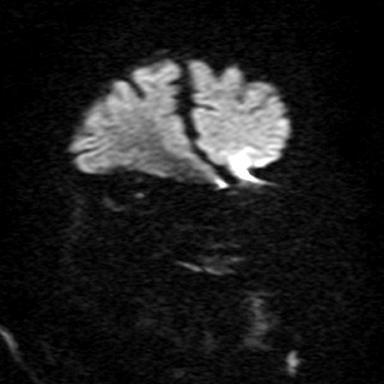
[im 38/38]
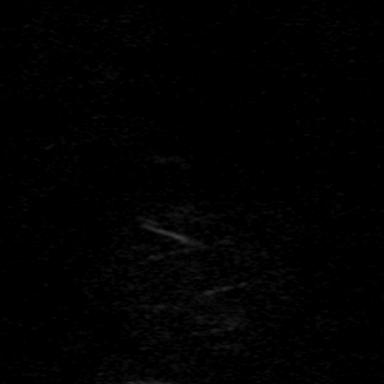

[Series 7: T2 · axial · 5.0mm · 0.44mm/px · z∈[-63,+79]mm · 3 of 23 slices shown (1 of 3)]
[im 1/23]
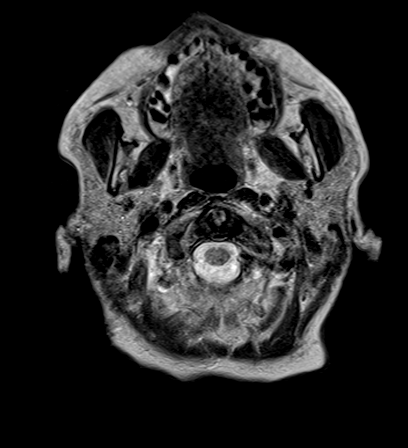
[im 12/23]
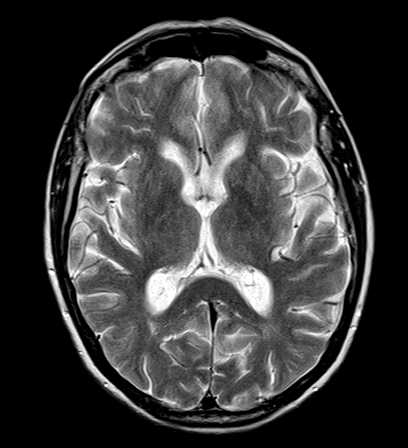
[im 23/23]
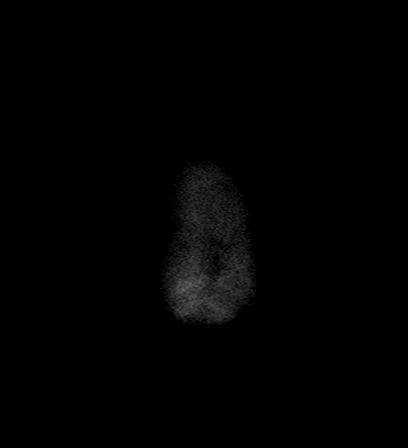

[Series 8: T2 · axial · 4.0mm · 0.38mm/px · z∈[-46,+63]mm · 3 of 23 slices shown (2 of 3)]
[im 1/23]
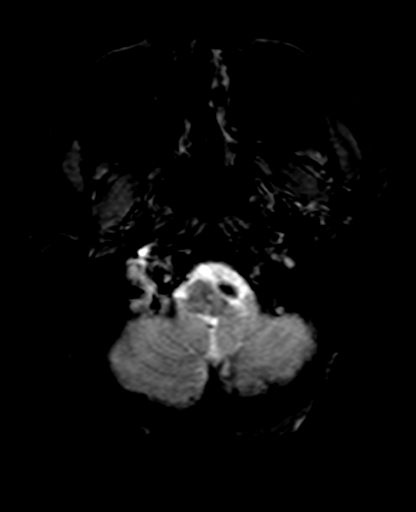
[im 12/23]
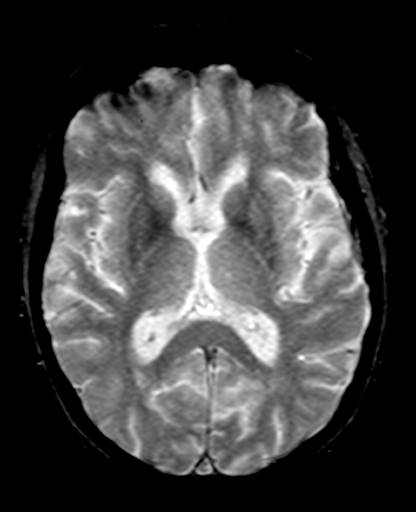
[im 23/23]
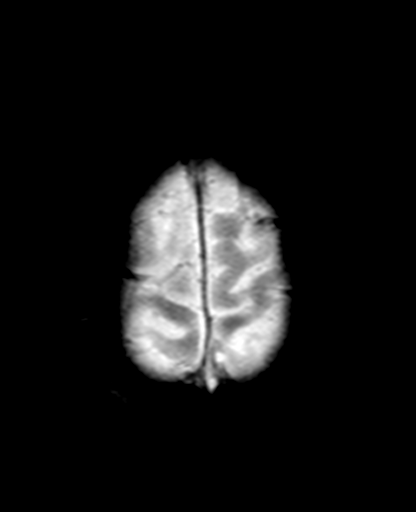

[Series 9: FLAIR · axial · 3.0mm · 0.30mm/px · z∈[-60,+77]mm · 6 of 47 slices shown]
[im 1/47]
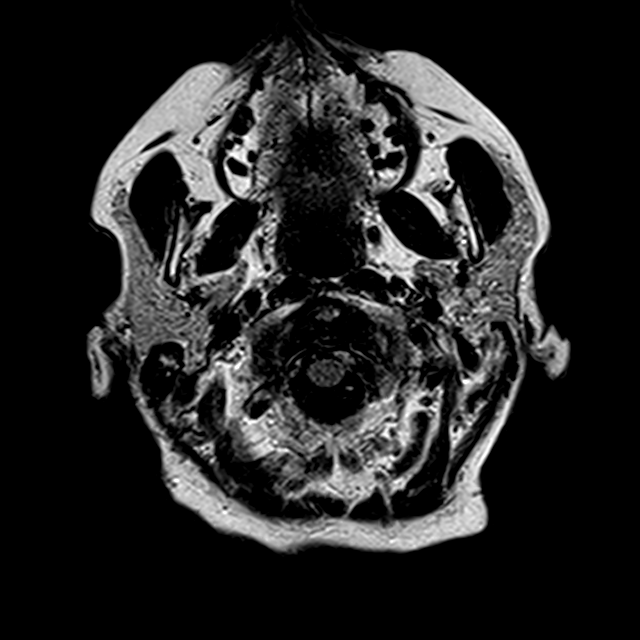
[im 10/47]
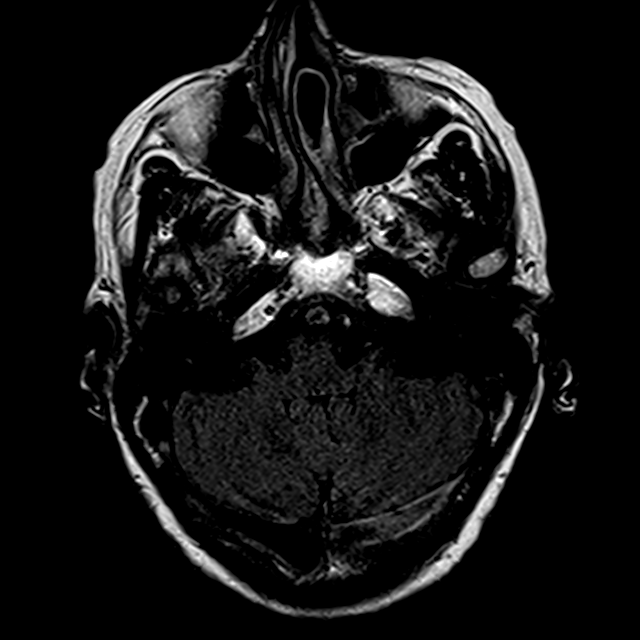
[im 19/47]
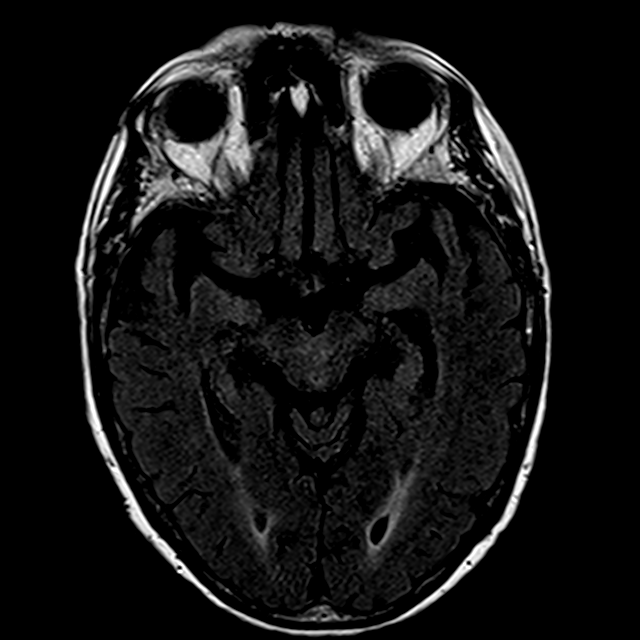
[im 28/47]
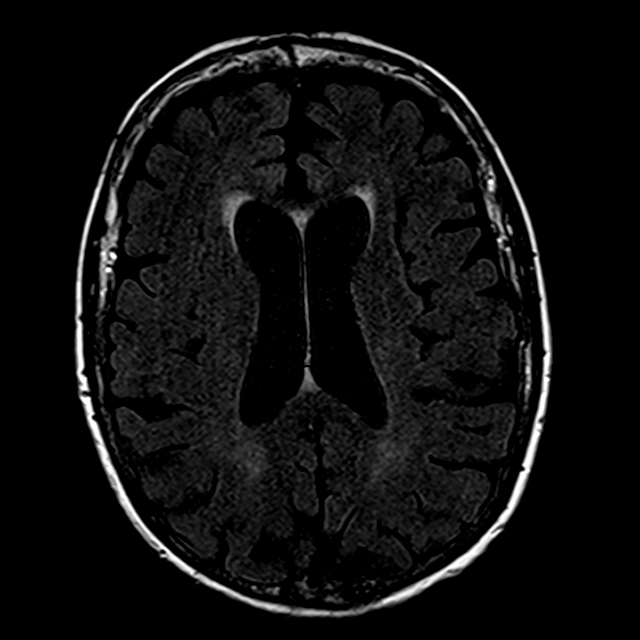
[im 37/47]
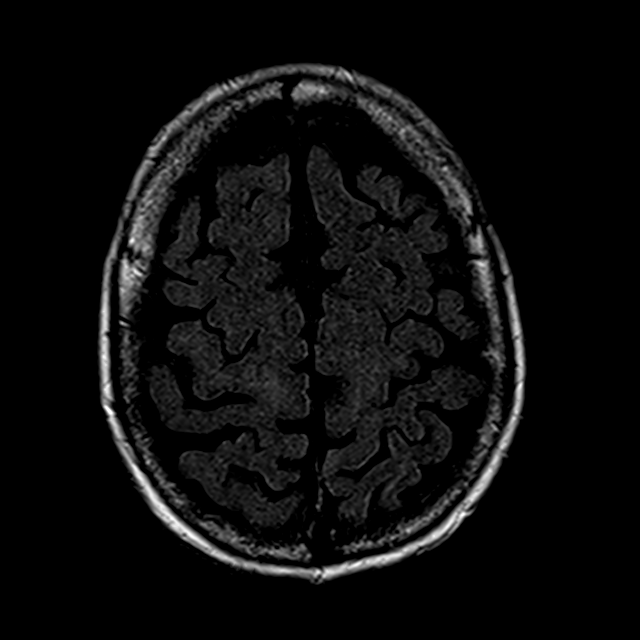
[im 47/47]
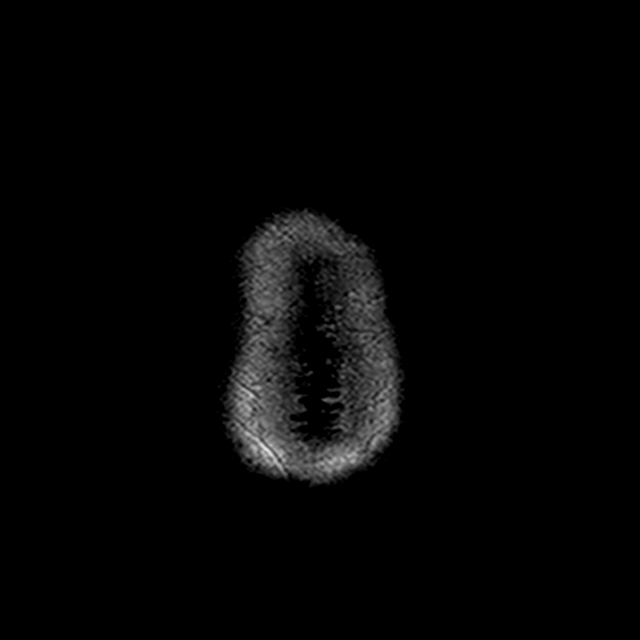

[Series 11: T2 · coronal · 5.0mm · 0.45mm/px · 3 of 28 slices shown (3 of 3)]
[im 1/28]
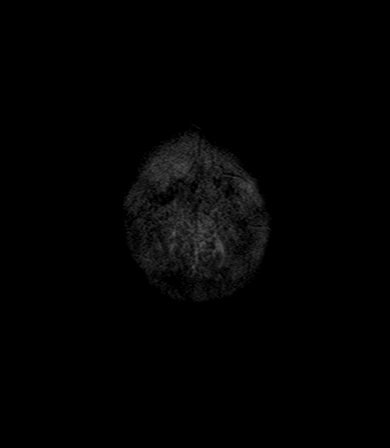
[im 14/28]
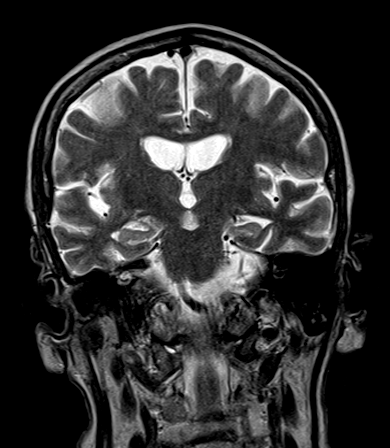
[im 28/28]
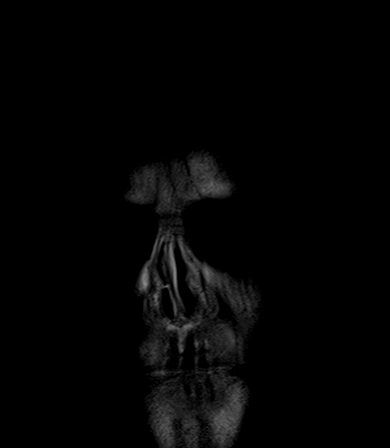

[25 of 48 positions shown; findings below may reference images not displayed]

FINDINGS: Brain: Mild to moderate atrophy.  Negative for hydrocephalus.

Negative for acute infarct. Mild white matter changes most
consistent with chronic microvascular ischemia. Brainstem and
cerebellum normal. Negative for hemorrhage mass or edema.

Vascular: Normal arterial flow voids.

Skull and upper cervical spine: Negative

Sinuses/Orbits: Mild mucosal edema paranasal sinuses. Bilateral
cataract surgery.

Other: None
IMPRESSION: Mild to moderate atrophy without acute abnormality. Mild chronic
microvascular ischemic change in the white matter.

## 2020-08-19 DIAGNOSIS — R454 Irritability and anger: Secondary | ICD-10-CM | POA: Diagnosis not present

## 2020-08-19 DIAGNOSIS — R634 Abnormal weight loss: Secondary | ICD-10-CM | POA: Diagnosis not present

## 2020-10-30 DIAGNOSIS — Z89429 Acquired absence of other toe(s), unspecified side: Secondary | ICD-10-CM | POA: Diagnosis not present

## 2020-10-30 DIAGNOSIS — H353132 Nonexudative age-related macular degeneration, bilateral, intermediate dry stage: Secondary | ICD-10-CM | POA: Diagnosis not present

## 2020-10-30 DIAGNOSIS — H01004 Unspecified blepharitis left upper eyelid: Secondary | ICD-10-CM | POA: Diagnosis not present

## 2020-10-30 DIAGNOSIS — H01002 Unspecified blepharitis right lower eyelid: Secondary | ICD-10-CM | POA: Diagnosis not present

## 2020-10-30 DIAGNOSIS — H01001 Unspecified blepharitis right upper eyelid: Secondary | ICD-10-CM | POA: Diagnosis not present

## 2020-11-19 DIAGNOSIS — R634 Abnormal weight loss: Secondary | ICD-10-CM | POA: Diagnosis not present

## 2020-11-19 DIAGNOSIS — J45901 Unspecified asthma with (acute) exacerbation: Secondary | ICD-10-CM | POA: Diagnosis not present

## 2020-11-19 DIAGNOSIS — Z23 Encounter for immunization: Secondary | ICD-10-CM | POA: Diagnosis not present

## 2020-12-18 DIAGNOSIS — H5200 Hypermetropia, unspecified eye: Secondary | ICD-10-CM | POA: Diagnosis not present

## 2020-12-18 DIAGNOSIS — H5203 Hypermetropia, bilateral: Secondary | ICD-10-CM | POA: Diagnosis not present

## 2020-12-18 DIAGNOSIS — Z0100 Encounter for examination of eyes and vision without abnormal findings: Secondary | ICD-10-CM | POA: Diagnosis not present

## 2021-04-09 DIAGNOSIS — E785 Hyperlipidemia, unspecified: Secondary | ICD-10-CM | POA: Diagnosis not present

## 2021-04-09 DIAGNOSIS — R413 Other amnesia: Secondary | ICD-10-CM | POA: Diagnosis not present

## 2021-04-09 DIAGNOSIS — R739 Hyperglycemia, unspecified: Secondary | ICD-10-CM | POA: Diagnosis not present

## 2021-04-09 DIAGNOSIS — J4520 Mild intermittent asthma, uncomplicated: Secondary | ICD-10-CM | POA: Diagnosis not present

## 2021-04-09 DIAGNOSIS — Z79899 Other long term (current) drug therapy: Secondary | ICD-10-CM | POA: Diagnosis not present

## 2021-04-09 DIAGNOSIS — M810 Age-related osteoporosis without current pathological fracture: Secondary | ICD-10-CM | POA: Diagnosis not present

## 2021-04-09 DIAGNOSIS — M1990 Unspecified osteoarthritis, unspecified site: Secondary | ICD-10-CM | POA: Diagnosis not present

## 2021-04-16 DIAGNOSIS — R413 Other amnesia: Secondary | ICD-10-CM | POA: Diagnosis not present

## 2021-04-16 DIAGNOSIS — R739 Hyperglycemia, unspecified: Secondary | ICD-10-CM | POA: Diagnosis not present

## 2021-04-16 DIAGNOSIS — Z0000 Encounter for general adult medical examination without abnormal findings: Secondary | ICD-10-CM | POA: Diagnosis not present

## 2021-04-16 DIAGNOSIS — R634 Abnormal weight loss: Secondary | ICD-10-CM | POA: Diagnosis not present

## 2021-04-16 DIAGNOSIS — J45901 Unspecified asthma with (acute) exacerbation: Secondary | ICD-10-CM | POA: Diagnosis not present

## 2021-05-07 DIAGNOSIS — H01005 Unspecified blepharitis left lower eyelid: Secondary | ICD-10-CM | POA: Diagnosis not present

## 2021-05-07 DIAGNOSIS — H01002 Unspecified blepharitis right lower eyelid: Secondary | ICD-10-CM | POA: Diagnosis not present

## 2021-05-07 DIAGNOSIS — H353134 Nonexudative age-related macular degeneration, bilateral, advanced atrophic with subfoveal involvement: Secondary | ICD-10-CM | POA: Diagnosis not present

## 2021-05-07 DIAGNOSIS — H01001 Unspecified blepharitis right upper eyelid: Secondary | ICD-10-CM | POA: Diagnosis not present

## 2021-05-07 DIAGNOSIS — Z961 Presence of intraocular lens: Secondary | ICD-10-CM | POA: Diagnosis not present

## 2021-05-07 DIAGNOSIS — H01004 Unspecified blepharitis left upper eyelid: Secondary | ICD-10-CM | POA: Diagnosis not present

## 2021-05-07 DIAGNOSIS — Z89429 Acquired absence of other toe(s), unspecified side: Secondary | ICD-10-CM | POA: Diagnosis not present

## 2021-07-08 DIAGNOSIS — R739 Hyperglycemia, unspecified: Secondary | ICD-10-CM | POA: Diagnosis not present

## 2021-11-19 DIAGNOSIS — H01002 Unspecified blepharitis right lower eyelid: Secondary | ICD-10-CM | POA: Diagnosis not present

## 2021-11-19 DIAGNOSIS — H353134 Nonexudative age-related macular degeneration, bilateral, advanced atrophic with subfoveal involvement: Secondary | ICD-10-CM | POA: Diagnosis not present

## 2021-11-19 DIAGNOSIS — H01004 Unspecified blepharitis left upper eyelid: Secondary | ICD-10-CM | POA: Diagnosis not present

## 2021-11-19 DIAGNOSIS — H01001 Unspecified blepharitis right upper eyelid: Secondary | ICD-10-CM | POA: Diagnosis not present

## 2021-11-24 DIAGNOSIS — H01001 Unspecified blepharitis right upper eyelid: Secondary | ICD-10-CM | POA: Diagnosis not present

## 2021-11-24 DIAGNOSIS — H01002 Unspecified blepharitis right lower eyelid: Secondary | ICD-10-CM | POA: Diagnosis not present

## 2021-11-24 DIAGNOSIS — H18421 Band keratopathy, right eye: Secondary | ICD-10-CM | POA: Diagnosis not present

## 2021-11-24 DIAGNOSIS — H353134 Nonexudative age-related macular degeneration, bilateral, advanced atrophic with subfoveal involvement: Secondary | ICD-10-CM | POA: Diagnosis not present

## 2021-12-29 DIAGNOSIS — H18421 Band keratopathy, right eye: Secondary | ICD-10-CM | POA: Diagnosis not present

## 2022-01-18 ENCOUNTER — Other Ambulatory Visit (HOSPITAL_COMMUNITY): Payer: Self-pay | Admitting: Internal Medicine

## 2022-01-18 ENCOUNTER — Ambulatory Visit (HOSPITAL_COMMUNITY)
Admission: RE | Admit: 2022-01-18 | Discharge: 2022-01-18 | Disposition: A | Discharge status: Home / Self Care | Payer: Medicare HMO | Source: Ambulatory Visit | Attending: Internal Medicine | Admitting: Internal Medicine

## 2022-01-18 DIAGNOSIS — Z96641 Presence of right artificial hip joint: Secondary | ICD-10-CM | POA: Diagnosis not present

## 2022-01-18 DIAGNOSIS — M25551 Pain in right hip: Secondary | ICD-10-CM

## 2022-01-18 DIAGNOSIS — M545 Low back pain, unspecified: Secondary | ICD-10-CM | POA: Insufficient documentation

## 2022-01-18 DIAGNOSIS — M47816 Spondylosis without myelopathy or radiculopathy, lumbar region: Secondary | ICD-10-CM | POA: Diagnosis not present

## 2022-01-18 DIAGNOSIS — Z471 Aftercare following joint replacement surgery: Secondary | ICD-10-CM | POA: Diagnosis not present

## 2022-04-23 DIAGNOSIS — E785 Hyperlipidemia, unspecified: Secondary | ICD-10-CM | POA: Diagnosis not present

## 2022-04-23 DIAGNOSIS — M1990 Unspecified osteoarthritis, unspecified site: Secondary | ICD-10-CM | POA: Diagnosis not present

## 2022-04-23 DIAGNOSIS — J4530 Mild persistent asthma, uncomplicated: Secondary | ICD-10-CM | POA: Diagnosis not present

## 2022-04-23 DIAGNOSIS — R739 Hyperglycemia, unspecified: Secondary | ICD-10-CM | POA: Diagnosis not present

## 2022-04-23 DIAGNOSIS — Z79899 Other long term (current) drug therapy: Secondary | ICD-10-CM | POA: Diagnosis not present

## 2022-04-23 DIAGNOSIS — R413 Other amnesia: Secondary | ICD-10-CM | POA: Diagnosis not present

## 2022-04-26 DIAGNOSIS — R413 Other amnesia: Secondary | ICD-10-CM | POA: Diagnosis not present

## 2022-04-26 DIAGNOSIS — Z23 Encounter for immunization: Secondary | ICD-10-CM | POA: Diagnosis not present

## 2022-04-26 DIAGNOSIS — R7309 Other abnormal glucose: Secondary | ICD-10-CM | POA: Diagnosis not present

## 2022-04-26 DIAGNOSIS — E785 Hyperlipidemia, unspecified: Secondary | ICD-10-CM | POA: Diagnosis not present

## 2022-04-26 DIAGNOSIS — Z0000 Encounter for general adult medical examination without abnormal findings: Secondary | ICD-10-CM | POA: Diagnosis not present

## 2022-04-26 DIAGNOSIS — J45901 Unspecified asthma with (acute) exacerbation: Secondary | ICD-10-CM | POA: Diagnosis not present

## 2022-04-27 DIAGNOSIS — H353134 Nonexudative age-related macular degeneration, bilateral, advanced atrophic with subfoveal involvement: Secondary | ICD-10-CM | POA: Diagnosis not present

## 2022-04-27 DIAGNOSIS — H18421 Band keratopathy, right eye: Secondary | ICD-10-CM | POA: Diagnosis not present

## 2022-11-03 DIAGNOSIS — H353134 Nonexudative age-related macular degeneration, bilateral, advanced atrophic with subfoveal involvement: Secondary | ICD-10-CM | POA: Diagnosis not present

## 2022-11-03 DIAGNOSIS — H18421 Band keratopathy, right eye: Secondary | ICD-10-CM | POA: Diagnosis not present

## 2022-11-03 DIAGNOSIS — Z89429 Acquired absence of other toe(s), unspecified side: Secondary | ICD-10-CM | POA: Diagnosis not present

## 2023-04-25 DIAGNOSIS — Z111 Encounter for screening for respiratory tuberculosis: Secondary | ICD-10-CM | POA: Diagnosis not present

## 2023-06-11 ENCOUNTER — Other Ambulatory Visit: Payer: Self-pay

## 2023-06-11 ENCOUNTER — Encounter: Payer: Self-pay | Admitting: Emergency Medicine

## 2023-06-11 ENCOUNTER — Ambulatory Visit (INDEPENDENT_AMBULATORY_CARE_PROVIDER_SITE_OTHER)

## 2023-06-11 ENCOUNTER — Ambulatory Visit: Admission: EM | Admit: 2023-06-11 | Discharge: 2023-06-11 | Disposition: A | Discharge status: Home / Self Care

## 2023-06-11 DIAGNOSIS — W19XXXA Unspecified fall, initial encounter: Secondary | ICD-10-CM

## 2023-06-11 DIAGNOSIS — R918 Other nonspecific abnormal finding of lung field: Secondary | ICD-10-CM | POA: Diagnosis not present

## 2023-06-11 DIAGNOSIS — R0781 Pleurodynia: Secondary | ICD-10-CM

## 2023-06-11 DIAGNOSIS — M8580 Other specified disorders of bone density and structure, unspecified site: Secondary | ICD-10-CM | POA: Diagnosis not present

## 2023-06-11 HISTORY — DX: Unspecified dementia, unspecified severity, without behavioral disturbance, psychotic disturbance, mood disturbance, and anxiety: F03.90

## 2023-06-11 MED ORDER — ONDANSETRON 4 MG PO TBDP: 4.0000 mg | ORAL_TABLET | Freq: Three times a day (TID) | ORAL | 0 refills | Status: AC | PRN

## 2023-06-11 MED ORDER — HYDROCODONE-ACETAMINOPHEN 5-325 MG PO TABS: 1.0000 | ORAL_TABLET | Freq: Two times a day (BID) | ORAL | 0 refills | Status: AC | PRN

## 2023-06-11 MED ORDER — HYDROCODONE-ACETAMINOPHEN 5-325 MG PO TABS: 1.0000 | ORAL_TABLET | Freq: Two times a day (BID) | ORAL | 0 refills | Status: DC | PRN

## 2023-06-11 NOTE — ED Triage Notes (Signed)
 Pt family reports pt was out in the yard x2 days ago and reports had unwitnessed fall. Pt has been complaining of bilateral rib cage pain, trouble laying flat ever since fall.

## 2023-06-11 NOTE — Discharge Instructions (Signed)
 Will let you know if anything comes back abnormal on the radiology report.  I have sent in some pain medication as well as nausea medication.  Follow-up for significantly worsening symptoms.

## 2023-06-12 ENCOUNTER — Telehealth: Payer: Self-pay

## 2023-06-12 NOTE — Telephone Encounter (Signed)
 Pt daughter is in charge of receiving information on the patient's care. Spoke to pts daughter and she verbalized understanding of the fluid that is on the bottom of the patients lungs. Stated she will take her to her PCP.

## 2023-06-14 NOTE — ED Provider Notes (Signed)
 [facility]    CSN: [id number] Arrival date & time: 06/11/23  1215      History   Chief Complaint Chief Complaint  Patient presents with   Fall    HPI Rachel [name] is a 89 y.o. female.   Patient presenting today with family members who act as primary caregivers and provide most of the history today.  Family states that she fell out in the yard unwitnessed about 2 days ago and has since been complaining of bilateral rib pain.  In the member state they are unsure if it is anterior, posterior or lateral but she clutches at her rib region particularly with movement at times.  They deny notice of deformities, bruising, swelling, loss of range of motion, mental status changes beyond baseline dementia, vomiting, appetite changes.  They have been giving her leftover hydrocodone  with mild temporary benefit to her pain.  They deny her being on any prescription anticoagulation.    Past Medical History:  Diagnosis Date   Anxiety    Arthritis    Asthma    Basal cell cancer    Dementia (HCC)    Seasonal allergies     Patient Active Problem List   Diagnosis Date Noted   Hip flexor tendonitis 02/01/2012   Hematochezia 01/10/2012   IMPINGEMENT SYNDROME 09/18/2008   TOTAL HIP FOLLOW-UP 09/18/2007   DEGENERATIVE JOINT DISEASE, RIGHT HIP 12/01/2006    Past Surgical History:  Procedure Laterality Date   AMPUTATION Left 04/01/2015   Procedure: PARTIAL SECOND RAY AMPUATATION OF LEFT FOOT (AMPUTATION OF TOE AND PORTION OF SECOND METATARSAL);  Surgeon: Iverson Market, DPM;  Location: [facility];  Service: Podiatry;  Laterality: Left;   APPENDECTOMY     bladder tack  1968   BREAST LUMPECTOMY Left    CATARACT EXTRACTION W/PHACO Left 04/18/2017   Procedure: CATARACT EXTRACTION PHACO AND INTRAOCULAR LENS PLACEMENT (IOC);  Surgeon: Anner Kill, MD;  Location: [facility];  Service: Ophthalmology;  Laterality: Left;  CDE: 6.79   CATARACT EXTRACTION W/PHACO Right 05/02/2017   Procedure:  CATARACT EXTRACTION PHACO AND INTRAOCULAR LENS PLACEMENT (IOC);  Surgeon: Anner Kill, MD;  Location: [facility];  Service: Ophthalmology;  Laterality: Right;  CDE: 7.64    COLONOSCOPY  05/23/01    WUJ:WJXB papilla and internal hemorrhoids, otherwise normal rectum/  Left-sided diverticula.  The remainder of the colonic mucosa appeared normal   COLONOSCOPY  02/03/2012   Procedure: COLONOSCOPY;  Surgeon: Suzette Espy, MD;  Location: [facility];  Service: Endoscopy;  Laterality: N/A;  8:45   HIP SURGERY Right    total hip   JOINT REPLACEMENT Right    total hip   LUMBAR DISC SURGERY     TONSILLECTOMY      OB History   No obstetric history on file.      Home Medications    Prior to Admission medications   Medication Sig Start Date End Date Taking? Authorizing Provider  mirtazapine (REMERON) 7.5 MG tablet Take 7.5 mg by mouth at bedtime. 03/30/23  Yes [provider]  ondansetron  (ZOFRAN -ODT) 4 MG disintegrating tablet Take 1 tablet (4 mg total) by mouth every 8 (eight) hours as needed for nausea or vomiting. 06/11/23  Yes Corbin Dess, PA-C  sertraline (ZOLOFT) 100 MG tablet Take 100 mg by mouth daily. 04/17/23  Yes [provider]  sertraline (ZOLOFT) 50 MG tablet Take 50 mg by mouth daily. 05/07/23  Yes [provider]  albuterol  (PROAIR  HFA) 108 (90 BASE) MCG/ACT  inhaler Inhale 2 puffs into the lungs every 6 (six) hours as needed for shortness of breath. Pain. 11/28/12   Dorenda Gandy, MD  budesonide  (PULMICORT ) 180 MCG/ACT inhaler Inhale 2 puffs into the lungs 2 (two) times daily.    [provider]  citalopram (CELEXA) 20 MG tablet Take 20 mg by mouth daily.    [provider]  HYDROcodone -acetaminophen  (NORCO/VICODIN) 5-325 MG tablet Take 1 tablet by mouth 2 (two) times daily as needed for moderate pain (pain score 4-6). 06/11/23   Corbin Dess, PA-C  hydrocortisone cream 1 % Apply 1 application topically 3 (three) times  daily.    [provider]  Multiple Vitamins-Minerals (PRESERVISION AREDS PO) Take 1 tablet by mouth daily.    [provider]  naproxen sodium (ALEVE) 220 MG tablet Take 220 mg by mouth daily as needed (pain).    [provider]  Polyethyl Glycol-Propyl Glycol (SYSTANE OP) Apply 1 drop to eye 2 (two) times daily.    [provider]    Family History Family History  Problem Relation Age of Onset   Colon cancer Paternal Uncle 60    Social History Social History   Tobacco Use   Smoking status: Former    Current packs/day: 0.00    Average packs/day: 0.5 packs/day for 10.0 years (5.0 ttl pk-yrs)    Types: Cigarettes    Start date: 02/23/1951    Quit date: 02/22/1961    Years since quitting: 62.3   Smokeless tobacco: Never   Tobacco comments:    about 50 years ago  Substance Use Topics   Alcohol use: Yes    Comment: wine 3-4 nights/wk   Drug use: No     Allergies   Patient has no known allergies.   Review of Systems Review of Systems Per HPI  Physical Exam Triage Vital Signs ED Triage Vitals [06/11/23 1326]  Encounter Vitals Group     BP (!) 93/58     Systolic BP Percentile      Diastolic BP Percentile      Pulse Rate 90     Resp 20     Temp 97.9 F (36.6 C)     Temp Source Oral     SpO2 94 %     Weight      Height      Head Circumference      Peak Flow      Pain Score      Pain Loc      Pain Education      Exclude from Growth Chart    No data found.  Updated Vital Signs BP (!) 93/58 (BP Location: Right Arm)   Pulse 90   Temp 97.9 F (36.6 C) (Oral)   Resp 20   SpO2 94%   Visual Acuity Right Eye Distance:   Left Eye Distance:   Bilateral Distance:    Right Eye Near:   Left Eye Near:    Bilateral Near:     Physical Exam Vitals and nursing note reviewed.  Constitutional:      Appearance: Normal appearance. She is not ill-appearing.  HENT:     Head: Atraumatic.  Eyes:     Extraocular Movements:  Extraocular movements intact.     Conjunctiva/sclera: Conjunctivae normal.  Cardiovascular:     Rate and Rhythm: Normal rate and regular rhythm.  Pulmonary:     Effort: Pulmonary effort is normal.     Breath sounds: Normal breath sounds. No wheezing or  rales.     Comments: Chest rise symmetric bilaterally Musculoskeletal:        General: Signs of injury present. No swelling or deformity. Normal range of motion.     Cervical back: Normal range of motion and neck supple.     Comments: Patient is denying any tenderness to palpation on thorough examination of the ribs from all directions no bony deformities palpable  Skin:    General: Skin is warm and dry.     Findings: No bruising.  Neurological:     Mental Status: She is alert. Mental status is at baseline.     Motor: No weakness.     Gait: Gait normal.  Psychiatric:        Mood and Affect: Mood normal.        Thought Content: Thought content normal.        Judgment: Judgment normal.    UC Treatments / Results  Labs (all labs ordered are listed, but only abnormal results are displayed) Labs Reviewed - No data to display  EKG   Radiology No results found.  Procedures Procedures (including critical care time)  Medications Ordered in UC Medications - No data to display  Initial Impression / Assessment and Plan / UC Course  I have reviewed the triage vital signs and the nursing notes.  Pertinent labs & imaging results that were available during my care of the patient were reviewed by me and considered in my medical decision making (see chart for details).     Vital signs and exam overall reassuring with no red flag findings.  X-ray of bilateral ribs pending, hydrocodone  refilled for severe pain episodes and discussed lidocaine  patches, heating pads, rest and primary care follow-up.  Return for worsening symptoms.  Final Clinical Impressions(s) / UC Diagnoses   Final diagnoses:  Rib pain  Fall, initial encounter      Discharge Instructions      Will let you know if anything comes back abnormal on the radiology report.  I have sent in some pain medication as well as nausea medication.  Follow-up for significantly worsening symptoms.    ED Prescriptions     Medication Sig Dispense Auth. Provider   HYDROcodone -acetaminophen  (NORCO/VICODIN) 5-325 MG tablet  (Status: Discontinued) Take 1 tablet by mouth 2 (two) times daily as needed for moderate pain (pain score 4-6). 10 tablet Corbin Dess, PA-C   HYDROcodone -acetaminophen  (NORCO/VICODIN) 5-325 MG tablet Take 1 tablet by mouth 2 (two) times daily as needed for moderate pain (pain score 4-6). 10 tablet Corbin Dess, PA-C   ondansetron  (ZOFRAN -ODT) 4 MG disintegrating tablet Take 1 tablet (4 mg total) by mouth every 8 (eight) hours as needed for nausea or vomiting. 20 tablet Corbin Dess, [place]      I have reviewed the PDMP during this encounter.   Corbin Dess, [place] 06/14/23 1448

## 2023-07-24 DEATH — deceased
